# Patient Record
Sex: Female | Born: 1990 | Hispanic: Yes | Marital: Single | Smoking: Never smoker
Health system: Southern US, Community
[De-identification: ages and names within clinical notes are randomized; demographics above are authoritative.]

## PROBLEM LIST (undated history)

## (undated) ENCOUNTER — Inpatient Hospital Stay (HOSPITAL_COMMUNITY): Payer: Self-pay

## (undated) DIAGNOSIS — Z8619 Personal history of other infectious and parasitic diseases: Secondary | ICD-10-CM

## (undated) DIAGNOSIS — N200 Calculus of kidney: Secondary | ICD-10-CM

## (undated) DIAGNOSIS — H109 Unspecified conjunctivitis: Secondary | ICD-10-CM

## (undated) DIAGNOSIS — A749 Chlamydial infection, unspecified: Secondary | ICD-10-CM

## (undated) DIAGNOSIS — B009 Herpesviral infection, unspecified: Secondary | ICD-10-CM

## (undated) DIAGNOSIS — I471 Supraventricular tachycardia: Secondary | ICD-10-CM

## (undated) DIAGNOSIS — O2230 Deep phlebothrombosis in pregnancy, unspecified trimester: Secondary | ICD-10-CM

## (undated) DIAGNOSIS — E282 Polycystic ovarian syndrome: Secondary | ICD-10-CM

## (undated) HISTORY — DX: Herpesviral infection, unspecified: B00.9

## (undated) HISTORY — PX: WISDOM TOOTH EXTRACTION: SHX21

## (undated) HISTORY — DX: Chlamydial infection, unspecified: A74.9

## (undated) HISTORY — DX: Unspecified conjunctivitis: H10.9

## (undated) HISTORY — PX: FRACTURE SURGERY: SHX138

---
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
## 2009-03-04 ENCOUNTER — Other Ambulatory Visit: Admission: RE | Admit: 2009-03-04 | Discharge: 2009-03-04 | Payer: Self-pay | Admitting: Gynecology

## 2009-03-04 ENCOUNTER — Encounter: Payer: Self-pay | Admitting: Gynecology

## 2009-03-04 ENCOUNTER — Ambulatory Visit: Payer: Self-pay | Admitting: Gynecology

## 2009-11-12 ENCOUNTER — Other Ambulatory Visit: Admission: RE | Admit: 2009-11-12 | Discharge: 2009-11-12 | Payer: Self-pay | Admitting: Gynecology

## 2009-11-12 ENCOUNTER — Ambulatory Visit: Payer: Self-pay | Admitting: Gynecology

## 2010-07-16 ENCOUNTER — Other Ambulatory Visit: Admission: RE | Admit: 2010-07-16 | Discharge: 2010-07-16 | Payer: Self-pay | Admitting: Gynecology

## 2010-07-16 ENCOUNTER — Ambulatory Visit: Payer: Self-pay | Admitting: Gynecology

## 2010-07-23 ENCOUNTER — Ambulatory Visit: Payer: Self-pay | Admitting: Gynecology

## 2010-07-31 ENCOUNTER — Ambulatory Visit: Payer: Self-pay | Admitting: Gynecology

## 2011-05-11 ENCOUNTER — Ambulatory Visit (INDEPENDENT_AMBULATORY_CARE_PROVIDER_SITE_OTHER): Payer: 59 | Admitting: Gynecology

## 2011-05-11 DIAGNOSIS — N898 Other specified noninflammatory disorders of vagina: Secondary | ICD-10-CM

## 2011-05-11 DIAGNOSIS — B373 Candidiasis of vulva and vagina: Secondary | ICD-10-CM

## 2011-05-11 DIAGNOSIS — R82998 Other abnormal findings in urine: Secondary | ICD-10-CM

## 2011-05-11 DIAGNOSIS — N760 Acute vaginitis: Secondary | ICD-10-CM

## 2011-05-11 DIAGNOSIS — Z113 Encounter for screening for infections with a predominantly sexual mode of transmission: Secondary | ICD-10-CM

## 2011-08-06 ENCOUNTER — Ambulatory Visit (INDEPENDENT_AMBULATORY_CARE_PROVIDER_SITE_OTHER): Payer: 59 | Admitting: Gynecology

## 2011-08-06 ENCOUNTER — Other Ambulatory Visit (HOSPITAL_COMMUNITY)
Admission: RE | Admit: 2011-08-06 | Discharge: 2011-08-06 | Disposition: A | Discharge status: Home / Self Care | Payer: 59 | Source: Ambulatory Visit | Attending: Gynecology | Admitting: Gynecology

## 2011-08-06 ENCOUNTER — Encounter: Payer: Self-pay | Admitting: Gynecology

## 2011-08-06 DIAGNOSIS — R87810 Cervical high risk human papillomavirus (HPV) DNA test positive: Secondary | ICD-10-CM | POA: Insufficient documentation

## 2011-08-06 DIAGNOSIS — Z01419 Encounter for gynecological examination (general) (routine) without abnormal findings: Secondary | ICD-10-CM

## 2011-08-06 DIAGNOSIS — Z23 Encounter for immunization: Secondary | ICD-10-CM

## 2011-08-06 DIAGNOSIS — Z113 Encounter for screening for infections with a predominantly sexual mode of transmission: Secondary | ICD-10-CM

## 2011-08-06 DIAGNOSIS — N390 Urinary tract infection, site not specified: Secondary | ICD-10-CM

## 2011-08-06 DIAGNOSIS — R3915 Urgency of urination: Secondary | ICD-10-CM

## 2011-08-06 DIAGNOSIS — R82998 Other abnormal findings in urine: Secondary | ICD-10-CM

## 2011-08-06 LAB — URINALYSIS, ROUTINE W REFLEX MICROSCOPIC
Ketones, ur: NEGATIVE
Protein, ur: NEGATIVE
Urobilinogen, UA: 0.2

## 2011-08-06 LAB — CBC
HCT: 30 — ABNORMAL LOW
MCHC: 35.5
MCV: 92.3
RBC: 3.25 — ABNORMAL LOW
RBC: 3.53 — ABNORMAL LOW
WBC: 13 — ABNORMAL HIGH
WBC: 14.2 — ABNORMAL HIGH

## 2011-08-06 LAB — RH IMMUNE GLOB WKUP(>/=20WKS)(NOT WOMEN'S HOSP)

## 2011-08-06 LAB — PROTIME-INR: INR: 1

## 2011-08-06 LAB — RAPID URINE DRUG SCREEN, HOSP PERFORMED
Amphetamines: NOT DETECTED
Cocaine: NOT DETECTED
Opiates: NOT DETECTED
Tetrahydrocannabinol: NOT DETECTED

## 2011-08-06 LAB — TYPE AND SCREEN: Antibody Screen: NEGATIVE

## 2011-08-06 LAB — APTT: aPTT: 27

## 2011-08-06 LAB — URINE MICROSCOPIC-ADD ON

## 2011-08-06 LAB — FIBRINOGEN: Fibrinogen: 666 — ABNORMAL HIGH

## 2011-08-06 LAB — URIC ACID: Uric Acid, Serum: 3.8

## 2011-08-06 MED ORDER — NITROFURANTOIN MONOHYD MACRO 100 MG PO CAPS: 100.0000 mg | ORAL_CAPSULE | Freq: Two times a day (BID) | ORAL | Status: AC

## 2011-08-06 NOTE — Patient Instructions (Signed)
Remember to do your monthly self breast exams. Also remember that your second dose of Gardasil is end of November and the final one at the end of March. Your IUD is good for two more years.

## 2011-08-06 NOTE — Progress Notes (Signed)
Cassandra Russell 1991/04/28 [id number]   History:    20 y.o.  for annual exam was complaining of dysuria and frequency. She also wanted to have the Gardasil Vaccine series started. She appears that reviewed the information provided on the Gardasil Vaccine. She has a Mirena IUD was placed in 2009. She in frequently does her self breast examination. She does have a history of positive Chlamydia culture in the past.  Past medical history,surgical history, family history and social history were all reviewed and documented in the EPIC chart.  ROS:  Was performed and pertinent positives and negatives are included in the history.  Exam: chaperone present BP 100/68  Ht 5' 0.75" (1.543 m)  Wt 128 lb (58.06 kg)  BMI 24.38 kg/m2  Body mass index is 24.38 kg/(m^2).  General appearance : Well developed well nourished female. No acute distress HEENT: Neck supple, trachea midline, no carotid bruits, no thyroidmegaly Lungs: Clear to auscultation, no rhonchi or wheezes, or rib retractions  Heart: Regular rate and rhythm, no murmurs or gallops Breast:Examined in sitting and supine position were symmetrical in appearance, no palpable masses or tenderness,  no skin retraction, no nipple inversion, no nipple discharge, no skin discoloration, no axillary or supraclavicular lymphadenopathy Abdomen: no palpable masses or tenderness, no rebound or guarding Extremities: no edema or skin discoloration or tenderness  Pelvic:  Bartholin, Urethra, Skene Glands: Within normal limits             Vagina: No gross lesions or discharge  Cervix: No gross lesions or discharge  Uterus  anteverted, normal size, shape and consistency, non-tender and mobile  Adnexa  Without masses or tenderness  Anus and perineum  normal   Rectovaginal  normal sphincter tone without palpated masses or tenderness             Hemoccult not done     Assessment/Plan:  20 y.o. female for annual exam with urinalysis significant for urinary  tract infection. She will be placed on Macrobid 1 tablet twice a day for 7 day course. She was encouraged to do her monthly self breast examination for which literature information was provided. She received the first of 3 Gardasil Vaccine series today. CBC and Pap smear along with GC and Chlamydia culture was done today as well.    [name] MD, 12:23 PM 08/06/2011

## 2011-08-13 ENCOUNTER — Other Ambulatory Visit: Payer: Self-pay | Admitting: *Deleted

## 2011-08-13 DIAGNOSIS — A64 Unspecified sexually transmitted disease: Secondary | ICD-10-CM

## 2011-08-13 MED ORDER — AZITHROMYCIN 1 G PO PACK
1.0000 | PACK | Freq: Once | ORAL | Status: AC
Start: 2011-08-13 — End: 2011-09-12

## 2011-08-20 ENCOUNTER — Ambulatory Visit (INDEPENDENT_AMBULATORY_CARE_PROVIDER_SITE_OTHER): Payer: 59 | Admitting: Gynecology

## 2011-08-20 ENCOUNTER — Encounter: Payer: Self-pay | Admitting: Gynecology

## 2011-08-20 VITALS — BP 120/78

## 2011-08-20 DIAGNOSIS — Z113 Encounter for screening for infections with a predominantly sexual mode of transmission: Secondary | ICD-10-CM

## 2011-08-20 DIAGNOSIS — N390 Urinary tract infection, site not specified: Secondary | ICD-10-CM

## 2011-08-20 DIAGNOSIS — N879 Dysplasia of cervix uteri, unspecified: Secondary | ICD-10-CM

## 2011-08-20 NOTE — Progress Notes (Signed)
The patient is a 20 year old who see the office for gynecological examination September 27 she was recently treated for urinary tract infection GC Chlamydia culture been done to the office as well it should positive for Chlamydia. She was placed on Zithromax 1 g by mouth for 1 dose and was instructed to have her partner be tested as well. Today she was going to have an HIV RPR hepatitis B and hepatitis C screen does well. 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  Colposcopic evaluation: Detail inspection of the external genitalia vagina and cervix were undertaken with the colposcope no visual lesions were noted. Endocervical speculum was utilized transformation zone was visualized and an ECC was obtained. The IUD string was seen and no other lesions were noted.  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 Discussed recent guidelines and recommendations for followup in the turbinate was provided. She has received her first dose of the tarsal laxity last month she will return back in 2 months for her second dose of the tarsal vaccine and will repeat a GC Chlamydia culture that point. We'll have her return back to the office for followup Pap smear 6 months. The following dialyzer discussed with the patient and cotton was provided as well.                                               CIN I Mild Dysplasia   ASCCP guidelines, developed prior to the change in screening recommendations, indicated that for adolescents (age 41 to 40 according to the ASCCP guidelines) who have had cervical cytology, expectant management of confirmed CIN 1 is preferred, irrespective of the index cytologic diagnosis or a satisfactory or unsatisfactory colposcopic examination, because undetected high grade disease is uncommon in this setting, invasive cancer is rare, and regression to normal is common  Adolescents - Current guidelines from professional societies advise against commencing  cervical cancer screening before age 84. ASCCP guidelines, developed prior to the change in screening recommendations, indicated that in adolescents (age 28 to 8 according to the ASCCP guidelines) with CIN 2,3, the rate of regression is high and progression to invasive cancer is extremely small. Therefore, observation with colposcopy and cytology at six-month intervals for up to two years is preferable to excisional or ablative therapy for reliable adolescents with biopsy-confirmed CIN 2,3 as long as colposcopy is satisfactory, endocervical curettage is negative, and the possibility of occult disease is acknowledged by the patientRepeat biopsy is indicated if high grade disease persists at 12 months and treatment is indicated if it persists for 24 months.If there is a specific histological diagnosis of CIN 2 (rather than CIN 2,3), observation is preferable to treatment, while ablation or excision has traditionally been recommended for adolescents with CIN 3 or with unsatisfactory colposcopy Whether the latter recommendation should be modified is a matter of debate, given current screening guidelines and the above discussed natural history and risk/benefit data. After two consecutive negative results, the patient can return to a routine screening interval

## 2011-08-20 NOTE — Progress Notes (Signed)
Addended by: Cammie Mcgee T on: 08/20/2011 04:28 PM   Modules accepted: Orders

## 2011-08-21 ENCOUNTER — Encounter: Payer: Self-pay | Admitting: *Deleted

## 2011-08-21 LAB — HEPATITIS C ANTIBODY: HCV Ab: NEGATIVE

## 2011-08-21 NOTE — Progress Notes (Signed)
  Pt informed with results from result note 08/20/11 surgical pathology. Recall letter in computer for 6 month pap receheck.

## 2011-08-28 ENCOUNTER — Telehealth: Payer: Self-pay | Admitting: *Deleted

## 2011-08-28 NOTE — Telephone Encounter (Signed)
error 

## 2011-08-28 NOTE — Progress Notes (Signed)
Pt informed with the below note, recall letter in computer. 

## 2011-08-28 NOTE — Telephone Encounter (Signed)
Message copied by Aura Camps on Fri Aug 28, 2011 11:18 AM ------      Message from: [name]      Created: Fri Aug 28, 2011 10:00 AM       Patient was seen in the office recently for colposcopic evaluation and the fact she had low-grade SIL. Evaluation was negative with a benign ECC. Patient will followup in 6 months for followup Pap smear as previously instructed.

## 2011-08-28 NOTE — Telephone Encounter (Signed)
Pt informed with the below note. Recall letter in computer.

## 2011-09-02 ENCOUNTER — Ambulatory Visit: Payer: 59 | Admitting: Gynecology

## 2011-10-06 ENCOUNTER — Ambulatory Visit (INDEPENDENT_AMBULATORY_CARE_PROVIDER_SITE_OTHER): Payer: 59 | Admitting: Gynecology

## 2011-10-06 ENCOUNTER — Encounter: Payer: Self-pay | Admitting: Gynecology

## 2011-10-06 VITALS — BP 110/70

## 2011-10-06 DIAGNOSIS — Z23 Encounter for immunization: Secondary | ICD-10-CM

## 2011-10-06 DIAGNOSIS — Z708 Other sex counseling: Secondary | ICD-10-CM

## 2011-10-06 DIAGNOSIS — Z113 Encounter for screening for infections with a predominantly sexual mode of transmission: Secondary | ICD-10-CM

## 2011-10-06 NOTE — Progress Notes (Signed)
20 year old who presented to the office today for her second dose of the Gardasil Vaccine as well as for test of cure (treated for positive Chlamydia culture in September 2012).  Patient with Mirena IUD for contraception. Normal or light cycles no other complaints today.  Pap smear was normal in September 2012. New screening guidelines discussed as described below. GC and Chlamydia culture was obtained today. Second dose of the Gardasil Vaccine was administered today and she will return back in March of 2013 for a third and final dose.  Screening methods for cervical cancer: Joint Recommendations of the American Cancer Society, the American Society ofColposcopy and Cervical Pathology (ASCCP), and the American Society for Clinical Pathology:  Women's age 49-29 years should be tested with cervical cytology alone, and screening should be performed every 3 years. Co-testing should not be performed in women younger than 21 years of age. For women age 50-57 years of age, co-testing with cytology and HPV testing every 5 years is preferred. Screening with cytology alone every 3 years is acceptable.

## 2012-04-13 ENCOUNTER — Encounter: Payer: Self-pay | Admitting: Gynecology

## 2012-04-13 ENCOUNTER — Other Ambulatory Visit: Payer: Self-pay | Admitting: Gynecology

## 2012-04-13 ENCOUNTER — Ambulatory Visit (INDEPENDENT_AMBULATORY_CARE_PROVIDER_SITE_OTHER): Payer: 59 | Admitting: Gynecology

## 2012-04-13 VITALS — BP 116/72

## 2012-04-13 DIAGNOSIS — Z8619 Personal history of other infectious and parasitic diseases: Secondary | ICD-10-CM | POA: Insufficient documentation

## 2012-04-13 DIAGNOSIS — R300 Dysuria: Secondary | ICD-10-CM

## 2012-04-13 DIAGNOSIS — N390 Urinary tract infection, site not specified: Secondary | ICD-10-CM

## 2012-04-13 DIAGNOSIS — N898 Other specified noninflammatory disorders of vagina: Secondary | ICD-10-CM

## 2012-04-13 DIAGNOSIS — Z23 Encounter for immunization: Secondary | ICD-10-CM

## 2012-04-13 DIAGNOSIS — Z113 Encounter for screening for infections with a predominantly sexual mode of transmission: Secondary | ICD-10-CM

## 2012-04-13 HISTORY — DX: Personal history of other infectious and parasitic diseases: Z86.19

## 2012-04-13 LAB — URINALYSIS W MICROSCOPIC + REFLEX CULTURE
Hgb urine dipstick: NEGATIVE
Ketones, ur: NEGATIVE mg/dL
Nitrite: POSITIVE — AB
Protein, ur: NEGATIVE mg/dL
Urobilinogen, UA: 0.2 mg/dL (ref 0.0–1.0)

## 2012-04-13 LAB — WET PREP FOR TRICH, YEAST, CLUE
Clue Cells Wet Prep HPF POC: NONE SEEN
Yeast Wet Prep HPF POC: NONE SEEN

## 2012-04-13 MED ORDER — CLINDAMYCIN PHOSPHATE 2 % VA CREA: 1.0000 | TOPICAL_CREAM | Freq: Every day | VAGINAL | Status: AC

## 2012-04-13 MED ORDER — CIPROFLOXACIN HCL 250 MG PO TABS: 250.0000 mg | ORAL_TABLET | Freq: Two times a day (BID) | ORAL | Status: AC

## 2012-04-13 NOTE — Patient Instructions (Signed)

## 2012-04-13 NOTE — Progress Notes (Signed)
21 year old that presented to the office today complaining of frequent urination and dysuria as well as urgency. Patient denied any fever chills nausea or vomiting. Some mild mid-suprapubic discomfort. She was also complaining of a slight clear vaginal discharge. Review of her record indicated the following:  Past history of Chlamydia infection October 2012 negative RPR, hepatitis B, and hepatitis C and HIV.  September 2012:LOW GRADE SQUAMOUS INTRAEPITHELIAL LESION: CIN-1/ HPV (LSIL). With high-risk HPV  Exam: Back: No CVA tenderness Abdomen: Soft nontender no rebound or guarding slight discomfort suprapubic Pelvic: Bartholin urethra Skene was within normal limits Vagina: Slight white discharge was noted Cervix: IUD string was seen mucoid material Uterus: Anteverted normal size shape and consistency Adnexa no palpable masses or tenderness  Urinalysis: 7-10 WBC and many bacteria Wet prep to numerous to count bacteria and a few white blood cells  Assessment/plan: #1 signs and symptoms consistent with urinary tract infection will be placed on Cipro 250 mg twice a day for 3 days. Samples apply radium to take 1 by mouth 3 times a day for 2 days was provided. 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 She will be returning in 3 months for her annual exam and followup Pap smear. #4 GC and Chlamydia culture pending at time of this dictation. #5 the third Gardasil Vaccine was administered today.

## 2012-04-14 LAB — GC/CHLAMYDIA PROBE AMP, GENITAL: Chlamydia, DNA Probe: NEGATIVE

## 2012-04-16 LAB — URINE CULTURE

## 2012-07-28 ENCOUNTER — Telehealth: Payer: Self-pay | Admitting: *Deleted

## 2012-07-28 NOTE — Telephone Encounter (Signed)
Pt was given Cipro 250 mg x 6 days on 04/13/12, pt didn't take Rx as directed she missed 2 days of rx. Pt said her symptoms are now back frequent urination, light burning. Pt asked if she could have another round of medication due to rx. Please advise

## 2012-07-28 NOTE — Telephone Encounter (Signed)
We'll call a different antibiotic: Macrobid one by mouth twice a day for 7 days #14.

## 2012-08-01 MED ORDER — NITROFURANTOIN MONOHYD MACRO 100 MG PO CAPS: 100.0000 mg | ORAL_CAPSULE | Freq: Two times a day (BID) | ORAL | Status: DC

## 2012-08-01 NOTE — Telephone Encounter (Signed)
Left on voicemail rx has been sent 

## 2012-08-09 ENCOUNTER — Encounter: Payer: 59 | Admitting: Gynecology

## 2012-08-11 ENCOUNTER — Encounter: Payer: Self-pay | Admitting: Gynecology

## 2012-08-11 ENCOUNTER — Other Ambulatory Visit (HOSPITAL_COMMUNITY)
Admission: RE | Admit: 2012-08-11 | Discharge: 2012-08-11 | Disposition: A | Discharge status: Home / Self Care | Payer: 59 | Source: Ambulatory Visit | Attending: Gynecology | Admitting: Gynecology

## 2012-08-11 ENCOUNTER — Ambulatory Visit (INDEPENDENT_AMBULATORY_CARE_PROVIDER_SITE_OTHER): Payer: 59 | Admitting: Gynecology

## 2012-08-11 VITALS — BP 112/70 | Ht 61.0 in | Wt 140.0 lb

## 2012-08-11 DIAGNOSIS — R635 Abnormal weight gain: Secondary | ICD-10-CM

## 2012-08-11 DIAGNOSIS — N898 Other specified noninflammatory disorders of vagina: Secondary | ICD-10-CM

## 2012-08-11 DIAGNOSIS — Z23 Encounter for immunization: Secondary | ICD-10-CM

## 2012-08-11 DIAGNOSIS — Z01419 Encounter for gynecological examination (general) (routine) without abnormal findings: Secondary | ICD-10-CM | POA: Insufficient documentation

## 2012-08-11 DIAGNOSIS — N870 Mild cervical dysplasia: Secondary | ICD-10-CM

## 2012-08-11 DIAGNOSIS — L293 Anogenital pruritus, unspecified: Secondary | ICD-10-CM

## 2012-08-11 LAB — CBC WITH DIFFERENTIAL/PLATELET
Eosinophils Absolute: 0.2 10*3/uL (ref 0.0–0.7)
Hemoglobin: 13.6 g/dL (ref 12.0–15.0)
Lymphs Abs: 1.2 10*3/uL (ref 0.7–4.0)
MCH: 29.4 pg (ref 26.0–34.0)
MCV: 87 fL (ref 78.0–100.0)
Monocytes Relative: 9 % (ref 3–12)
Neutrophils Relative %: 73 % (ref 43–77)
RBC: 4.63 MIL/uL (ref 3.87–5.11)

## 2012-08-11 LAB — LIPID PANEL
Cholesterol: 144 mg/dL (ref 0–200)
HDL: 51 mg/dL (ref >39)
Total CHOL/HDL Ratio: 2.8 Ratio
Triglycerides: 36 mg/dL (ref <150)
VLDL: 7 mg/dL (ref 0–40)

## 2012-08-11 LAB — TSH: TSH: 0.314 u[IU]/mL — ABNORMAL LOW (ref 0.350–4.500)

## 2012-08-11 LAB — WET PREP FOR TRICH, YEAST, CLUE: Clue Cells Wet Prep HPF POC: NONE SEEN

## 2012-08-11 NOTE — Patient Instructions (Addendum)
Diphtheria, Tetanus, and Pertussis (DTaP) Vaccine What You Need to Know WHY GET VACCINATED? Diphtheria, tetanus, and pertussis are serious diseases caused by bacteria. Diphtheria and pertussis are spread from person to person. Tetanus enters the body through cuts or wounds. Diphtheria causes a thick covering in the back of the throat.  It can lead to breathing problems, paralysis, heart failure, and even death. Tetanus (Lockjaw) causes painful tightening of the muscles, usually all over the body.  It can lead to "locking" of the jaw so the victim cannot open his or her mouth or swallow. Tetanus leads to death in about 2 out of 10 cases. Pertussis (Whooping Cough) causes coughing spells so bad that it is hard for infants to eat, drink, or breathe. These spells can last for weeks.  It can lead to pneumonia, seizures (jerking and staring spells), brain damage, and death. Diphtheria, tetanus, and pertussis vaccine (DTaP) can help prevent these diseases. Most children who are vaccinated with DTaP will be protected throughout childhood. Many more children would get these diseases if we stopped vaccinating. DTaP is a safer version of an older vaccine called DTP. DTP is no longer used in the [place]. WHO SHOULD GET DTAP VACCINE AND WHEN? Children should get 5 doses of DTaP vaccine, 1 dose at each of the following ages:  2 months.  4 months.  6 months.  15 to 18 months.  4 to 6 years. DTaP may be given at the same time as other vaccines. SOME CHILDREN SHOULD NOT GET DTAP VACCINE OR SHOULD WAIT  Children with minor illnesses, such as a cold, may be vaccinated. But children who are moderately or severely ill should usually wait until they recover before getting DTaP vaccine.  Any child who had a life-threatening allergic reaction after a dose of DTaP should not get another dose.  Any child who suffered a brain or nervous system disease within 7 days after a dose of DTaP should not get  another dose.  Talk with your caregiver if your child:  Had a seizure or collapsed after a dose of DTaP.  Cried non-stop for 3 hours or more after a dose of DTaP.  Had a fever over 105 F (40.6 C) after a dose of DTaP.  Ask your caregiver for more information. Some of these children should not get another dose of pertussis vaccine, but may get a vaccine without pertussis, called DT. OLDER CHILDREN AND ADULTS  DTaP is not licensed for adolescents, adults, or children 7 years of age and older.  But older people still need protection. A vaccine called Tdap is similar to DTaP. A single dose of Tdap is recommended for people 11 through 22 years of age. Another vaccine, called Td, protects against tetanus and diphtheria, but not pertussis. It is recommended every 10 years. WHAT ARE THE RISKS FROM DTAP VACCINE?  Getting diphtheria, tetanus, or pertussis disease is much riskier than getting DTaP vaccine.  However, a vaccine, like any medicine, is capable of causing serious problems, such as severe allergic reactions. The risk of DTaP vaccine causing serious harm, or death, is extremely small. Mild Problems (Common)  Fever (up to about 1 child in 4).  Redness or swelling where the shot was given (up to about 1 child in 4).  Soreness or tenderness where the shot was given (up to about 1 child in 4). These problems occur more often after the 4th and 5th doses of the DTaP series than after earlier doses. Sometimes the 4th   or 5th dose of DTaP vaccine is followed by swelling of the entire arm or leg in which the shot was given, lasting 1 to 7 days (up to about 1 child in 30). Other mild problems include:  Fussiness (up to about 1 child in 3).  Tiredness or poor appetite (up to about 1 child in 10).  Vomiting (up to about 1 child in 50). These problems generally occur 1 to 3 days after the shot. Moderate Problems (Uncommon)  Seizure (jerking or staring) (about 1 child out of  14,000).  Non-stop crying, for 3 hours or more (up to about 1 child out of 1,000).  High fever, over 105 F (40.6 C) (about 1 child out of 16,000). Severe Problems (Very Rare)  Serious allergic reaction (less than 1 out of a million doses).  Several other severe problems have been reported after DTaP vaccine. These include:  Long-term seizures, coma, or lowered consciousness.  Permanent brain damage. These are so rare it is hard to tell if they are caused by the vaccine. Controlling fever is especially important for children who have had seizures, for any reason. It is also important if another family member has had seizures. You can reduce fever and pain by giving your child an aspirin-free pain reliever when the shot is given, and for the next 24 hours, following the package instructions. WHAT IF THERE IS A MODERATE OR SEVERE REACTION? What should I look for? Any unusual conditions, such as a serious allergic reaction, high fever, or unusual behavior. Serious allergic reactions are extremely rare with any vaccine. If one were to occur, it would most likely be within a few minutes to a few hours after the shot. Signs can include difficulty breathing, hoarseness or wheezing, hives, paleness, weakness, a fast heartbeat, or dizziness. If a high fever or seizure were to occur, it would usually be within a week after the shot. What should I do?  Call your caregiver or get the person to a caregiver right away.  Tell the caregiver what happened, the date and time it happened, and when the vaccination was given.  Ask the caregiver, nurse, or health department to file a Vaccine Adverse Event Reporting System (VAERS) form. Or, you can file this report through the VAERS website at www.vaers.hhs.gov or by calling 1-800-822-7967. VAERS does not provide medical advice. THE NATIONAL VACCINE INJURY COMPENSATION PROGRAM  In the rare event that you or your child has a serious reaction to a vaccine, a  federal program has been created to help you pay for the care of those who have been harmed.  For details about the National Vaccine Injury Compensation Program, call 1-800-338-2382 or visit the program's website at www.hrsa.gov/vaccinecompensation HOW CAN I LEARN MORE?  Ask your caregiver. They can give you the vaccine package insert or suggest other sources of information.  Call your local or state health department's immunization program.  Contact the Centers for Disease Control and Prevention (CDC):  Call 1-800-232-4636 (1-800-CDC-INFO).  Visit the National Immunization Program's website at www.cdc.gov/vaccines CDC Diphtheria, Tetanus, and Pertussis (DTaP) Vaccine VIS (03/25/06) Document Released: 08/23/2006 Document Revised: 01/18/2012 Document Reviewed: 08/23/2006 [employer] Patient Information 2013 [employer].  

## 2012-08-11 NOTE — Progress Notes (Signed)
Cassandra Russell 09/02/1991 [id number]   History:    21 y.o.  for annual gyn exam who has a past history of Chlamydia and also low-grade dysplasia. She was complaining of slight vaginal pruritus. Patient's last Pap smear September 2012 demonstrated:  Low-grade squamous intraepithelial lesion with high-risk HPV detected  Subsequent colposcopy did not demonstrate any lesions and her ECC as follows:  Diagnosis Endocervical biopsy BENIGN ENDOCERVIX ADMIXED WITH ABUNDANT MUCUS AND BLOOD.   Patient has a Mirena IUD that was placed in 2009 and has very little menses. Patient completed her her [place] vaccine series this year. Patient does not recall her had received a dTap Vaccine. Past medical history,surgical history, family history and social history were all reviewed and documented in the EPIC chart.  Gynecologic History No LMP recorded. Patient is not currently having periods (Reason: IUD). Contraception: IUD Last Pap: See above. Results were: See above Last mammogram: Not indicated. Results were: Not indicated  Obstetric History OB History    Grav Para Term Preterm Abortions TAB SAB Ect Mult Living   1 1        1      # Outc Date GA Lbr Len/2nd Wgt Sex Del Anes PTL Lv   1 PAR     M SVD          ROS: A ROS was performed and pertinent positives and negatives are included in the history.  GENERAL: No fevers or chills. HEENT: No change in vision, no earache, sore throat or sinus congestion. NECK: No pain or stiffness. CARDIOVASCULAR: No chest pain or pressure. No palpitations. PULMONARY: No shortness of breath, cough or wheeze. GASTROINTESTINAL: No abdominal pain, nausea, vomiting or diarrhea, melena or bright red blood per rectum. GENITOURINARY: No urinary frequency, urgency, hesitancy or dysuria. MUSCULOSKELETAL: No joint or muscle pain, no back pain, no recent trauma. DERMATOLOGIC: No rash, no itching, no lesions. ENDOCRINE: No polyuria, polydipsia, no heat or cold intolerance. No recent  change in weight. HEMATOLOGICAL: No anemia or easy bruising or bleeding. NEUROLOGIC: No headache, seizures, numbness, tingling or weakness. PSYCHIATRIC: No depression, no loss of interest in normal activity or change in sleep pattern.     Exam: chaperone present  BP 112/70  Ht 5\' 1"  (1.549 m)  Wt 140 lb (63.504 kg)  BMI 26.45 kg/m2  Body mass index is 26.45 kg/(m^2).  General appearance : Well developed well nourished female. No acute distress HEENT: Neck supple, trachea midline, no carotid bruits, no thyroidmegaly Lungs: Clear to auscultation, no rhonchi or wheezes, or rib retractions  Heart: Regular rate and rhythm, no murmurs or gallops Breast:Examined in sitting and supine position were symmetrical in appearance, no palpable masses or tenderness,  no skin retraction, no nipple inversion, no nipple discharge, no skin discoloration, no axillary or supraclavicular lymphadenopathy Abdomen: no palpable masses or tenderness, no rebound or guarding Extremities: no edema or skin discoloration or tenderness  Pelvic:  Bartholin, Urethra, Skene Glands: Within normal limits             Vagina: No gross lesions or discharge  Cervix: No gross lesions or discharge, IUD string barely visible  Uterus  anteverted, normal size, shape and consistency, non-tender and mobile  Adnexa  Without masses or tenderness  Anus and perineum  normal   Rectovaginal  normal sphincter tone without palpated masses or tenderness             Hemoccult not indicated   Wet prep demonstrated moderate amount of bacteria   Assessment/Plan:  21 y.o. female for annual exam patient was treated for urinary tract infection June 5 of this year. She has a Mirena IUD for contraception the need to be changed next year. She was instructed to do her monthly self breast examination. Patient had a negative GC and Chlamydia culture in June of this year. Pap smear was repeated today. The following labs were ordered: Fasting blood sugar,  fasting lipid profile, TSH, CBC and urinalysis. Patient was counseled and she received a dTap Vaccine today.    [name] MD, 1:58 PM 08/11/2012

## 2012-08-12 ENCOUNTER — Other Ambulatory Visit: Payer: Self-pay | Admitting: Gynecology

## 2012-08-12 ENCOUNTER — Encounter: Payer: Self-pay | Admitting: Gynecology

## 2012-08-12 DIAGNOSIS — R7989 Other specified abnormal findings of blood chemistry: Secondary | ICD-10-CM

## 2012-08-12 LAB — URINALYSIS W MICROSCOPIC + REFLEX CULTURE
Hgb urine dipstick: NEGATIVE
Leukocytes, UA: NEGATIVE
Nitrite: NEGATIVE
Protein, ur: NEGATIVE mg/dL
pH: 6 (ref 5.0–8.0)

## 2012-08-15 ENCOUNTER — Other Ambulatory Visit: Payer: Self-pay | Admitting: Gynecology

## 2012-08-15 MED ORDER — FLUCONAZOLE 150 MG PO TABS: 150.0000 mg | ORAL_TABLET | Freq: Once | ORAL | Status: DC

## 2013-08-14 ENCOUNTER — Encounter: Payer: Self-pay | Admitting: Gynecology

## 2013-08-14 ENCOUNTER — Ambulatory Visit (INDEPENDENT_AMBULATORY_CARE_PROVIDER_SITE_OTHER): Payer: BC Managed Care – PPO | Admitting: Gynecology

## 2013-08-14 ENCOUNTER — Other Ambulatory Visit (HOSPITAL_COMMUNITY)
Admission: RE | Admit: 2013-08-14 | Discharge: 2013-08-14 | Disposition: A | Discharge status: Home / Self Care | Payer: Medicaid Other | Source: Ambulatory Visit | Attending: Gynecology | Admitting: Gynecology

## 2013-08-14 VITALS — BP 128/86 | Ht 60.5 in | Wt 139.0 lb

## 2013-08-14 DIAGNOSIS — Z113 Encounter for screening for infections with a predominantly sexual mode of transmission: Secondary | ICD-10-CM

## 2013-08-14 DIAGNOSIS — R635 Abnormal weight gain: Secondary | ICD-10-CM

## 2013-08-14 DIAGNOSIS — Z01419 Encounter for gynecological examination (general) (routine) without abnormal findings: Secondary | ICD-10-CM

## 2013-08-14 DIAGNOSIS — Z30432 Encounter for removal of intrauterine contraceptive device: Secondary | ICD-10-CM

## 2013-08-14 DIAGNOSIS — L659 Nonscarring hair loss, unspecified: Secondary | ICD-10-CM

## 2013-08-14 LAB — CBC WITH DIFFERENTIAL/PLATELET
Eosinophils Relative: 2 % (ref 0–5)
HCT: 38.7 % (ref 36.0–46.0)
Hemoglobin: 13.6 g/dL (ref 12.0–15.0)
Lymphocytes Relative: 36 % (ref 12–46)
Lymphs Abs: 2 10*3/uL (ref 0.7–4.0)
MCV: 86 fL (ref 78.0–100.0)
Monocytes Absolute: 0.3 10*3/uL (ref 0.1–1.0)
RBC: 4.5 MIL/uL (ref 3.87–5.11)
WBC: 5.7 10*3/uL (ref 4.0–10.5)

## 2013-08-14 LAB — URINALYSIS W MICROSCOPIC + REFLEX CULTURE
Bilirubin Urine: NEGATIVE
Glucose, UA: NEGATIVE mg/dL
Ketones, ur: NEGATIVE mg/dL
Protein, ur: NEGATIVE mg/dL
RBC / HPF: NONE SEEN RBC/hpf (ref <3)
Specific Gravity, Urine: >1.03 — ABNORMAL HIGH (ref 1.005–1.030)
Urobilinogen, UA: 0.2 mg/dL (ref 0.0–1.0)

## 2013-08-14 LAB — LIPID PANEL
HDL: 56 mg/dL (ref >39)
Triglycerides: 62 mg/dL (ref <150)

## 2013-08-14 LAB — HIV ANTIBODY (ROUTINE TESTING W REFLEX): HIV: NONREACTIVE

## 2013-08-14 LAB — HEPATITIS C ANTIBODY: HCV Ab: NEGATIVE

## 2013-08-14 LAB — THYROID PANEL WITH TSH
Free Thyroxine Index: 3.7 (ref 1.0–3.9)
TSH: 0.827 u[IU]/mL (ref 0.350–4.500)

## 2013-08-14 MED ORDER — CIPROFLOXACIN HCL 250 MG PO TABS: 250.0000 mg | ORAL_TABLET | Freq: Two times a day (BID) | ORAL | Status: DC

## 2013-08-14 NOTE — Progress Notes (Signed)
Cassandra Russell Oct 11, 1991 [id number]   History:    22 y.o.  for annual gyn exam who was requesting an STD screen. She works in dialysis as a med check. She was also complaining of some hair loss at times. She is also complaining of dysuria and frequency. Patient also wanted to have the Mirena IUD that was placing 2009. She is contemplating perhaps maybe getting pregnant in the near future and will use barrier contraception for now. Patient has a past history of Chlamydia and also low-grade dysplasia. 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  Pap smear was normal 2013. Patient completed Gardasil vaccine series in 2013.   Past medical history,surgical history, family history and social history were all reviewed and documented in the EPIC chart.  Gynecologic History No LMP recorded. Patient is not currently having periods (Reason: IUD). Contraception: IUD Last Pap: 2013. Results were: normal Last mammogram: that indicated. Results were: none indicated  Obstetric History OB History  Gravida Para Term Preterm AB SAB TAB Ectopic Multiple Living  1 1        1     # Outcome Date GA Lbr Len/2nd Weight Sex Delivery Anes PTL Lv  1 PAR     M SVD          ROS: A ROS was performed and pertinent positives and negatives are included in the history.  GENERAL: No fevers or chills. HEENT: No change in vision, no earache, sore throat or sinus congestion. NECK: No pain or stiffness. CARDIOVASCULAR: No chest pain or pressure. No palpitations. PULMONARY: No shortness of breath, cough or wheeze. GASTROINTESTINAL: No abdominal pain, nausea, vomiting or diarrhea, melena or bright red blood per rectum. GENITOURINARY: No urinary frequency, urgency, hesitancy or  dysuria. MUSCULOSKELETAL: No joint or muscle pain, no back pain, no recent trauma. DERMATOLOGIC: No rash, no itching, no lesions. ENDOCRINE: No polyuria, polydipsia, no heat or cold intolerance. No recent change in weight. HEMATOLOGICAL: No anemia or easy bruising or bleeding. NEUROLOGIC: No headache, seizures, numbness, tingling or weakness. PSYCHIATRIC: No depression, no loss of interest in normal activity or change in sleep pattern.     Exam: chaperone present  BP 128/86  Ht 5' 0.5" (1.537 m)  Wt 139 lb (63.05 kg)  BMI 26.69 kg/m2  Body mass index is 26.69 kg/(m^2).  General appearance : Well developed well nourished female. No acute distress HEENT: Neck supple, trachea midline, no carotid bruits, no thyroidmegaly Lungs: Clear to auscultation, no rhonchi or wheezes, or rib retractions  Heart: Regular rate and rhythm, no murmurs or gallops Breast:Examined in sitting and supine position were symmetrical in appearance, no palpable masses or tenderness,  no skin retraction, no nipple inversion, no nipple discharge, no skin discoloration, no axillary or supraclavicular lymphadenopathy Abdomen: no palpable masses or tenderness, no rebound or guarding Extremities: no edema or skin discoloration or tenderness  Pelvic:  Bartholin, Urethra, Skene Glands: Within normal limits             Vagina: No gross lesions or discharge  Cervix: No gross lesions or discharge  Uterus  anteverted, normal size, shape and consistency, non-tender and mobile  Adnexa  Without masses or tenderness  Anus and perineum  normal   Rectovaginal  normal sphincter tone without palpated masses or tenderness  Hemoccult when indicated   The cervix was cleansed with Betadine solution. The IUD string was not seen so a [place] clamp was placed in the endocervical canal to grasp the IUD string and retrieved it showed it to the patient and discarded.  Assessment/Plan:  22 y.o. female for annual exam who requested to  have her Mirena IUD removed. She will use very contraception. Literature formation on the ParaGard T380A nonhormonal IUD as well as the Nexplanon was provided. Patient's urinalysis today demonstrated 11-20 white blood cells per high-power field many bacteria. She will be treated for urinary tract infection with Cipro 250 mg one by mouth twice a day for 3 days. She was given sample of Urigesic to take one by mouth 4 times a day 2 days as an anti-spasmodic agent and to drink linear fluids. The following labs were ordered: Fasting lipid profile, thyroid function panel (last year TSH was borderline low), CBC, urinalysis, HIV, RPR and hepatitis C. GC from a culture pain resulting in time of this dictation. Pap smear done today if normal once again there will go to every 3 years as per new recommendations.  Note: This dictation was prepared with  Dragon/digital dictation along withSmart phrase technology. Any transcriptional errors that result from this process are unintentional.   [name] MD, 10:48 AM 08/14/2013

## 2013-08-14 NOTE — Patient Instructions (Signed)
Urinary Tract Infection  Urinary tract infections (UTIs) can develop anywhere along your urinary tract. Your urinary tract is your body's drainage system for removing wastes and extra water. Your urinary tract includes two kidneys, two ureters, a bladder, and a urethra. Your kidneys are a pair of bean-shaped organs. Each kidney is about the size of your fist. They are located below your ribs, one on each side of your spine.  CAUSES  Infections are caused by microbes, which are microscopic organisms, including fungi, viruses, and bacteria. These organisms are so small that they can only be seen through a microscope. Bacteria are the microbes that most commonly cause UTIs.  SYMPTOMS   Symptoms of UTIs may vary by age and gender of the patient and by the location of the infection. Symptoms in young women typically include a frequent and intense urge to urinate and a painful, burning feeling in the bladder or urethra during urination. Older women and men are more likely to be tired, shaky, and weak and have muscle aches and abdominal pain. A fever may mean the infection is in your kidneys. Other symptoms of a kidney infection include pain in your back or sides below the ribs, nausea, and vomiting.  DIAGNOSIS  To diagnose a UTI, your caregiver will ask you about your symptoms. Your caregiver also will ask to provide a urine sample. The urine sample will be tested for bacteria and white blood cells. White blood cells are made by your body to help fight infection.  TREATMENT   Typically, UTIs can be treated with medication. Because most UTIs are caused by a bacterial infection, they usually can be treated with the use of antibiotics. The choice of antibiotic and length of treatment depend on your symptoms and the type of bacteria causing your infection.  HOME CARE INSTRUCTIONS   If you were prescribed antibiotics, take them exactly as your caregiver instructs you. Finish the medication even if you feel better after you  have only taken some of the medication.   Drink enough water and fluids to keep your urine clear or pale yellow.   Avoid caffeine, tea, and carbonated beverages. They tend to irritate your bladder.   Empty your bladder often. Avoid holding urine for long periods of time.   Empty your bladder before and after sexual intercourse.   After a bowel movement, women should cleanse from front to back. Use each tissue only once.  SEEK MEDICAL CARE IF:    You have back pain.   You develop a fever.   Your symptoms do not begin to resolve within 3 days.  SEEK IMMEDIATE MEDICAL CARE IF:    You have severe back pain or lower abdominal pain.   You develop chills.   You have nausea or vomiting.   You have continued burning or discomfort with urination.  MAKE SURE YOU:    Understand these instructions.   Will watch your condition.   Will get help right away if you are not doing well or get worse.  Document Released: 08/05/2005 Document Revised: 04/26/2012 Document Reviewed: 12/04/2011  [employer] Patient Information 2014 [employer].

## 2013-08-15 LAB — GC/CHLAMYDIA PROBE AMP: CT Probe RNA: NEGATIVE

## 2013-08-16 ENCOUNTER — Telehealth: Payer: Self-pay | Admitting: Gynecology

## 2013-08-16 LAB — URINE CULTURE

## 2013-08-16 NOTE — Telephone Encounter (Signed)
08/16/13-Pt was advised today that her insurance will cover either the Paraguard IUD or Nexplanon at 100%, no copay. She will call to let us know which she wants. Pt was also reminded that either needs insertion while on cycle/WL

## 2013-08-18 ENCOUNTER — Other Ambulatory Visit: Payer: Self-pay | Admitting: Gynecology

## 2013-08-18 MED ORDER — NITROFURANTOIN MONOHYD MACRO 100 MG PO CAPS: 100.0000 mg | ORAL_CAPSULE | Freq: Two times a day (BID) | ORAL | Status: DC

## 2013-09-11 ENCOUNTER — Telehealth: Payer: Self-pay | Admitting: *Deleted

## 2013-09-11 DIAGNOSIS — N390 Urinary tract infection, site not specified: Secondary | ICD-10-CM

## 2013-09-11 MED ORDER — URIBEL 118 MG PO CAPS: 118.0000 mg | ORAL_CAPSULE | Freq: Four times a day (QID) | ORAL | Status: DC

## 2013-09-11 MED ORDER — NITROFURANTOIN MONOHYD MACRO 100 MG PO CAPS: 100.0000 mg | ORAL_CAPSULE | Freq: Two times a day (BID) | ORAL | Status: DC

## 2013-09-11 MED ORDER — FLUCONAZOLE 150 MG PO TABS: 150.0000 mg | ORAL_TABLET | Freq: Once | ORAL | Status: DC

## 2013-09-11 NOTE — Telephone Encounter (Signed)
Pt informed, rx sent, order placed for repeat u/a

## 2013-09-11 NOTE — Telephone Encounter (Signed)
Please call in the following: #1 Macrobid 1 by mouth twice a day for 7 days #2 Uribell one by mouth 4 times a day for 2 days #8 (if patient not pregnant) #3 patient to come by the office for urinalysis the week after she finishes the antibiotic treatment

## 2013-09-11 NOTE — Telephone Encounter (Signed)
Ok for Diflucan 

## 2013-09-11 NOTE — Telephone Encounter (Signed)
Pt called requesting refill on Macrobid that was given on 08/16/13 pt did not take medication as directed , skipped several days. Starting to have urgency and burning with urination again. She also asked if diflucan can be given for her after taking Macrobid due to yeast infection. Please advise

## 2013-09-11 NOTE — Telephone Encounter (Signed)
Diflucan due to yeast after Rx?

## 2013-10-09 DIAGNOSIS — I471 Supraventricular tachycardia, unspecified: Secondary | ICD-10-CM

## 2013-10-09 HISTORY — DX: Supraventricular tachycardia, unspecified: I47.10

## 2013-10-09 HISTORY — DX: Supraventricular tachycardia: I47.1

## 2013-10-17 ENCOUNTER — Emergency Department (HOSPITAL_COMMUNITY)
Admission: EM | Admit: 2013-10-17 | Discharge: 2013-10-17 | Disposition: A | Discharge status: Home / Self Care | Payer: BC Managed Care – PPO | Attending: Emergency Medicine | Admitting: Emergency Medicine

## 2013-10-17 ENCOUNTER — Emergency Department (HOSPITAL_COMMUNITY): Payer: BC Managed Care – PPO

## 2013-10-17 ENCOUNTER — Encounter (HOSPITAL_COMMUNITY): Payer: Self-pay | Admitting: Emergency Medicine

## 2013-10-17 ENCOUNTER — Other Ambulatory Visit: Payer: Self-pay

## 2013-10-17 DIAGNOSIS — R51 Headache: Secondary | ICD-10-CM | POA: Insufficient documentation

## 2013-10-17 DIAGNOSIS — F411 Generalized anxiety disorder: Secondary | ICD-10-CM | POA: Insufficient documentation

## 2013-10-17 DIAGNOSIS — E876 Hypokalemia: Secondary | ICD-10-CM | POA: Insufficient documentation

## 2013-10-17 DIAGNOSIS — Z8619 Personal history of other infectious and parasitic diseases: Secondary | ICD-10-CM | POA: Insufficient documentation

## 2013-10-17 DIAGNOSIS — Z3202 Encounter for pregnancy test, result negative: Secondary | ICD-10-CM | POA: Insufficient documentation

## 2013-10-17 DIAGNOSIS — I498 Other specified cardiac arrhythmias: Secondary | ICD-10-CM | POA: Insufficient documentation

## 2013-10-17 DIAGNOSIS — R002 Palpitations: Secondary | ICD-10-CM | POA: Insufficient documentation

## 2013-10-17 DIAGNOSIS — I471 Supraventricular tachycardia: Secondary | ICD-10-CM

## 2013-10-17 DIAGNOSIS — R209 Unspecified disturbances of skin sensation: Secondary | ICD-10-CM | POA: Insufficient documentation

## 2013-10-17 LAB — CBC WITH DIFFERENTIAL/PLATELET
Basophils Relative: 0 % (ref 0–1)
Eosinophils Absolute: 0.2 10*3/uL (ref 0.0–0.7)
Eosinophils Relative: 2 % (ref 0–5)
HCT: 39 % (ref 36.0–46.0)
Lymphocytes Relative: 39 % (ref 12–46)
Lymphs Abs: 3.4 10*3/uL (ref 0.7–4.0)
MCH: 30.2 pg (ref 26.0–34.0)
MCHC: 34.4 g/dL (ref 30.0–36.0)
MCV: 88 fL (ref 78.0–100.0)
Monocytes Absolute: 0.6 10*3/uL (ref 0.1–1.0)
Neutrophils Relative %: 52 % (ref 43–77)
RBC: 4.43 MIL/uL (ref 3.87–5.11)
RDW: 11.8 % (ref 11.5–15.5)
WBC: 8.7 10*3/uL (ref 4.0–10.5)

## 2013-10-17 LAB — POCT I-STAT, CHEM 8
BUN: 9 mg/dL (ref 6–23)
Calcium, Ion: 1.14 mmol/L (ref 1.12–1.23)
Creatinine, Ser: 0.9 mg/dL (ref 0.50–1.10)
HCT: 42 % (ref 36.0–46.0)
Sodium: 141 mEq/L (ref 135–145)
TCO2: 22 mmol/L (ref 0–100)

## 2013-10-17 LAB — URINALYSIS, ROUTINE W REFLEX MICROSCOPIC
Bilirubin Urine: NEGATIVE
Glucose, UA: NEGATIVE mg/dL
Hgb urine dipstick: NEGATIVE
Ketones, ur: NEGATIVE mg/dL
Leukocytes, UA: NEGATIVE
Protein, ur: NEGATIVE mg/dL

## 2013-10-17 MED ORDER — ADENOSINE 6 MG/2ML IV SOLN
6.0000 mg | Freq: Once | INTRAVENOUS | Status: AC
  Administered 2013-10-17: 6 mg via INTRAVENOUS

## 2013-10-17 MED ORDER — ADENOSINE 6 MG/2ML IV SOLN
INTRAVENOUS | Status: AC
  Filled 2013-10-17: qty 4

## 2013-10-17 MED ORDER — ADENOSINE 6 MG/2ML IV SOLN
INTRAVENOUS | Status: AC
  Filled 2013-10-17: qty 2

## 2013-10-17 MED ORDER — SODIUM CHLORIDE 0.9 % IV BOLUS (SEPSIS)
1000.0000 mL | Freq: Once | INTRAVENOUS | Status: AC
  Administered 2013-10-17: 1000 mL via INTRAVENOUS

## 2013-10-17 MED ORDER — DILTIAZEM HCL 25 MG/5ML IV SOLN
10.0000 mg | Freq: Once | INTRAVENOUS | Status: AC
  Administered 2013-10-17: 10 mg via INTRAVENOUS
  Filled 2013-10-17: qty 5

## 2013-10-17 MED ORDER — ADENOSINE 6 MG/2ML IV SOLN
12.0000 mg | Freq: Once | INTRAVENOUS | Status: AC
  Administered 2013-10-17: 12 mg via INTRAVENOUS

## 2013-10-17 NOTE — ED Notes (Addendum)
Pt anxious, hyperventilating, c/o CP, sob, light headedness, R arm tingling, onset around , "onset while son was having night terrors and I was trying to calm him down". No h/o same, HR 150. Denies pain at this time. Friend at [facility]. Pt trying to slow down breathing. Speech pressured.

## 2013-10-17 NOTE — ED Provider Notes (Signed)
CSN: [id number]     Arrival date & time 10/17/13  0251 History   First MD Initiated Contact with Patient 10/17/13 0320     Chief Complaint  Patient presents with  . Chest Pain  . Hyperventilating  . Headache  . Tingling  . Shortness of Breath   (Consider location/radiation/quality/duration/timing/severity/associated sxs/prior Treatment) HPI 22 year old female presents to emergency department from home with complaint of palpitations, chest pain, tingling, and shortness of breath.  Symptoms started just prior to arrival.  When her son woke up with a nightmare.  Patient reports she feels that her heart is pounding out of her chest.  She denies previous history of same.  She is unable to catch her breath.  She has tingling around her mouth and in her fingers. Past Medical History  Diagnosis Date  . Chlamydia   . Conjunctivitis    Past Surgical History  Procedure Laterality Date  . Intrauterine device insertion  2009   Family History  Problem Relation Age of Onset  . Hypertension Mother    History  Substance Use Topics  . Smoking status: Never Smoker   . Smokeless tobacco: Never Used  . Alcohol Use: No   OB History   Grav Para Term Preterm Abortions TAB SAB Ect Mult Living   1 1        1      Review of Systems  See History of Present Illness; otherwise all other systems are reviewed and negative Allergies  Review of patient's allergies indicates no known allergies.  Home Medications   Current Outpatient Rx  Name  Route  Sig  Dispense  Refill  . levonorgestrel (MIRENA) 20 MCG/24HR IUD   Intrauterine   1 each by Intrauterine route once. 2009           BP 124/70  Pulse 126  Temp(Src) 98.2 F (36.8 C) (Oral)  Resp 20  SpO2 100%  LMP 09/29/2013 Physical Exam  Constitutional: She is oriented to person, place, and time. She appears well-developed and well-nourished. She appears distressed.  Anxious appearing  HENT:  Head: Normocephalic and atraumatic.  Nose: Nose  normal.  Mouth/Throat: Oropharynx is clear and moist.  Eyes: Conjunctivae and EOM are normal. Pupils are equal, round, and reactive to light.  Neck: Normal range of motion. Neck supple. No JVD present. No tracheal deviation present. No thyromegaly present.  Cardiovascular: Normal rate, regular rhythm, normal heart sounds and intact distal pulses.  Exam reveals no gallop and no friction rub.   No murmur heard. Tachycardia  Pulmonary/Chest: Effort normal and breath sounds normal. No stridor. No respiratory distress. She has no wheezes. She has no rales. She exhibits no tenderness.  Tachypnea  Abdominal: Soft. Bowel sounds are normal. She exhibits no distension and no mass. There is no tenderness. There is no rebound and no guarding.  Musculoskeletal: Normal range of motion. She exhibits no edema and no tenderness.  Lymphadenopathy:    She has no cervical adenopathy.  Neurological: She is alert and oriented to person, place, and time. She exhibits normal muscle tone. Coordination normal.  Skin: Skin is warm and dry. No rash noted. No erythema. No pallor.  Psychiatric: She has a normal mood and affect. Her behavior is normal. Judgment and thought content normal.    ED Course  Procedures (including critical care time)  CRITICAL CARE Performed by: Olivia Mackie Total critical care time: 30 min Critical care time was exclusive of separately billable procedures and treating other patients. Critical care was  necessary to treat or prevent imminent or life-threatening deterioration. Critical care was time spent personally by me on the following activities: development of treatment plan with patient and/or surrogate as well as nursing, discussions with consultants, evaluation of patient's response to treatment, examination of patient, obtaining history from patient or surrogate, ordering and performing treatments and interventions, ordering and review of laboratory studies, ordering and review of  radiographic studies, pulse oximetry and re-evaluation of patient's condition.  Labs Review Labs Reviewed  POCT I-STAT, CHEM 8 - Abnormal; Notable for the following:    Potassium 3.0 (*)    Glucose, Bld 104 (*)    All other components within normal limits  CBC WITH DIFFERENTIAL  URINALYSIS, ROUTINE W REFLEX MICROSCOPIC  PREGNANCY, URINE   Imaging Review Dg Chest Port 1 View  10/17/2013   CLINICAL DATA:  Chest pain and tachycardia.  EXAM: PORTABLE CHEST - 1 VIEW  COMPARISON:  None.  FINDINGS: The lungs are well-aerated and clear. There is no evidence of focal opacification, pleural effusion or pneumothorax.  The cardiomediastinal silhouette is within normal limits. No acute osseous abnormalities are seen. External pacing pads are noted.  IMPRESSION: No acute cardiopulmonary process seen.   Electronically Signed   By: Roanna Raider M.D.   On: 10/17/2013 03:55    EKG Interpretation    Date/Time:  Tuesday October 17 2013 02:56:33 EST Ventricular Rate:  151 PR Interval:  99 QRS Duration: 68 QT Interval:  373 QTC Calculation: 591 R Axis:   80 Text Interpretation:  Supraventricular tachycardia Borderline Q waves in lateral leads ST depr, consider ischemia, inferior leads Prolonged QT interval Confirmed by Haya Hemler  MD, Shavaughn Seidl ([id number]) on 10/17/2013 4:40:19 AM            MDM   1. Supraventricular tachycardia   2. Hypokalemia    22 year old female with head CT with heart rates in the 150s.  Patient was given adenosine 6, 12, and 12.  Patient without specific break in her tachycardia, but noted to have P waves and rate decreased into the 130s after third round of adenosine.  She extremely anxious.  She was given a fluid bolus and 10 mg of Cardizem with improvement in her heart rate gradually into the 80s.  Patient noted to have slightly low potassium.  Expect this was secondary to her hyperventilation.  Patient given instructions to increase potassium in her diet.  We'll refer her to  cardiology for reevaluation.  She is feeling better and is ready to home    Olivia Mackie, MD 10/17/13 [phone]

## 2013-10-17 NOTE — ED Notes (Signed)
Dr. Otter into room. 

## 2013-11-30 ENCOUNTER — Encounter: Payer: Self-pay | Admitting: Internal Medicine

## 2013-11-30 ENCOUNTER — Encounter (INDEPENDENT_AMBULATORY_CARE_PROVIDER_SITE_OTHER): Payer: Self-pay

## 2013-11-30 ENCOUNTER — Ambulatory Visit (INDEPENDENT_AMBULATORY_CARE_PROVIDER_SITE_OTHER): Payer: BC Managed Care – PPO | Admitting: Internal Medicine

## 2013-11-30 VITALS — BP 90/70 | HR 70 | Ht 60.5 in | Wt 141.8 lb

## 2013-11-30 DIAGNOSIS — I471 Supraventricular tachycardia: Secondary | ICD-10-CM

## 2013-11-30 DIAGNOSIS — E876 Hypokalemia: Secondary | ICD-10-CM

## 2013-11-30 DIAGNOSIS — I498 Other specified cardiac arrhythmias: Secondary | ICD-10-CM

## 2013-11-30 LAB — BASIC METABOLIC PANEL
BUN: 10 mg/dL (ref 6–23)
CALCIUM: 9.5 mg/dL (ref 8.4–10.5)
CO2: 29 mEq/L (ref 19–32)
Chloride: 104 mEq/L (ref 96–112)
Creatinine, Ser: 0.6 mg/dL (ref 0.4–1.2)
GFR: 121.19 mL/min (ref >60.00)
GLUCOSE: 81 mg/dL (ref 70–99)
POTASSIUM: 3.6 meq/L (ref 3.5–5.1)
Sodium: 139 mEq/L (ref 135–145)

## 2013-11-30 NOTE — Patient Instructions (Signed)
Your physician recommends that you have lab work done --BMP (Basic metabolic panel)  Your physician recommends that you call for appointment as needed.

## 2013-11-30 NOTE — Progress Notes (Signed)
HPI Patinet is a 23 yo who is referred from ER for evaluation of SVT.   On 12/9 had been up with son who had  night terror.  After he went to bed she did not feel good  ANxious.  R side of , R chest, back of head begain to feel numb  Fingers numb. Heart racing. Earlier that day she had the shakes  Admits that day to drinking a lot of caffeine including a Red bull She wetn to ER  EKG showed ST heart rates in the 150s. Patient was given adenosine 6, 12, and 12. Patient without specific break in her tachycardia,rate decreased into the 130s after third round of adenosine. She was noted to be extremely anxious. She was given a fluid bolus and 10 mg of Cardizem with improvement in her heart rate gradually into the 80s. Patient noted to have slightly low potassium.  Occasional chest discomfort  Occurs with and without activy Occasionally feels like something sitting on chest She goes to school  Works part time  Has 1 child 71 yo (born premature)   Past Medical History   Diagnosis       No Known Allergies  No current outpatient prescriptions on file.   No current facility-administered medications for this visit.    Past Medical History  Diagnosis Date  . Chlamydia   . Conjunctivitis     Past Surgical History  Procedure Laterality Date  . Intrauterine device insertion  2009    Family History  Problem Relation Age of Onset  . Hypertension Mother     History   Social History  . Marital Status: Single    Spouse Name: N/A    Number of Children: N/A  . Years of Education: N/A   Occupational History  . Not on file.   Social History Main Topics  . Smoking status: Never Smoker   . Smokeless tobacco: Never Used  . Alcohol Use: No  . Drug Use: No  . Sexual Activity: Yes    Birth Control/ Protection: IUD     Comment: mirena   Other Topics Concern  . Not on file   Social History Narrative  . No narrative on file    Review of Systems:  All systems reviewed.  They are  negative to the above problem except as previously stated.  Vital Signs: BP 90/70  Pulse 70  Ht 5' 0.5" (1.537 m)  Wt 141 lb 12.8 oz (64.32 kg)  BMI 27.23 kg/m2  Physical Exam Patinet is in NAD HEENT:  Normocephalic, atraumatic. EOMI, PERRLA.  Neck: JVP is normal.  No bruits.  Lungs: clear to auscultation. No rales no wheezes.  Heart: Regular rate and rhythm. Normal S1, S2. No S3.   No significant murmurs. PMI not displaced.  Abdomen:  Supple, nontender. Normal bowel sounds. No masses. No hepatomegaly.  Extremities:   Good distal pulses throughout. No lower extremity edema.  Musculoskeletal :moving all extremities.  Neuro:   alert and oriented x3.  CN II-XII grossly intact.  EKG  SR 70 bpm  Assessment and Plan: 1.  Tachycardia  I do not see an EKG that shows an SVT  All EKGs from ER show ST. This goes along with gradual slowing of heart rate with adenosine, not abrupt conversion. Probably exacerbated by anxiety and caffeine I have told her to avoid excess caffeine  Stay hydrated, (can add a little salt if feels dizzy  BP is 90/) I would follow up prn if has  recurrence  2.  Numbness.  Symptoms sound like she was hyperventilating  3.  Hypokalemia  Recheck to confirm normalized  4.  HA  Patient with frontal headaches a lot.  Increase fluids and follow  5.  HCM  Lipids good.  TSH nornal (fall 2014)

## 2014-01-17 ENCOUNTER — Encounter (HOSPITAL_COMMUNITY): Payer: Self-pay | Admitting: Emergency Medicine

## 2014-01-17 ENCOUNTER — Emergency Department (HOSPITAL_COMMUNITY): Payer: Medicaid Other

## 2014-01-17 ENCOUNTER — Emergency Department (HOSPITAL_COMMUNITY)
Admission: EM | Admit: 2014-01-17 | Discharge: 2014-01-17 | Disposition: A | Discharge status: Home / Self Care | Payer: Medicaid Other | Attending: Emergency Medicine | Admitting: Emergency Medicine

## 2014-01-17 DIAGNOSIS — Z8669 Personal history of other diseases of the nervous system and sense organs: Secondary | ICD-10-CM | POA: Insufficient documentation

## 2014-01-17 DIAGNOSIS — Z8619 Personal history of other infectious and parasitic diseases: Secondary | ICD-10-CM | POA: Insufficient documentation

## 2014-01-17 DIAGNOSIS — S62629A Displaced fracture of medial phalanx of unspecified finger, initial encounter for closed fracture: Secondary | ICD-10-CM

## 2014-01-17 DIAGNOSIS — W010XXA Fall on same level from slipping, tripping and stumbling without subsequent striking against object, initial encounter: Secondary | ICD-10-CM | POA: Insufficient documentation

## 2014-01-17 DIAGNOSIS — IMO0002 Reserved for concepts with insufficient information to code with codable children: Secondary | ICD-10-CM | POA: Insufficient documentation

## 2014-01-17 DIAGNOSIS — Y939 Activity, unspecified: Secondary | ICD-10-CM | POA: Insufficient documentation

## 2014-01-17 DIAGNOSIS — Y9289 Other specified places as the place of occurrence of the external cause: Secondary | ICD-10-CM | POA: Insufficient documentation

## 2014-01-17 MED ORDER — OXYCODONE-ACETAMINOPHEN 5-325 MG PO TABS: 1.0000 | ORAL_TABLET | Freq: Three times a day (TID) | ORAL | Status: DC | PRN

## 2014-01-17 NOTE — Progress Notes (Signed)
Orthopedic Tech Progress Note Patient Details:  Andree CossLeslie A Malave 12-14-1990 [id number]  Ortho Devices Type of Ortho Device: Finger splint Ortho Device/Splint Interventions: Application   Cammer, Mickie BailJennifer Carol 01/17/2014, 10:24 AM

## 2014-01-17 NOTE — ED Notes (Signed)
Ice applied to area.   Ortho called to come and splint finger.

## 2014-01-17 NOTE — Discharge Instructions (Signed)
Please call and setup an appointment with Dr. Orlan Leavens, hand specialist to be reassessed early next week Please rest, ice, elevate the hands Splint cannot get wet-please cover when showering Please rest and stay hydrated Please avoid any physical or shortness activity Please take medications as prescribed-while on pain medications his be absolute no drinking alcohol, driving, operating any heavy machinery. If there is extra please dispose in a proper manner. Please do not take extra Tylenol for this can lead to Tylenol overdose and liver issues. Please continue to monitor symptoms closely if symptoms are to worsen or change (fever greater than 101, chills, numbness, tingling, fall, injury, worsening or changes to the finger) please report back to the ED immediately   Finger Fracture Fractures of fingers are breaks in the bones of the fingers. There are many types of fractures. There are different ways of treating these fractures. Your health care provider will discuss the best way to treat your fracture. CAUSES Traumatic injury is the main cause of broken fingers. These include:  Injuries while playing sports.  Workplace injuries.  Falls. RISK FACTORS Activities that can increase your risk of finger fractures include:  Sports.  Workplace activities that involve machinery.  A condition called osteoporosis, which can make your bones less dense and cause them to fracture more easily. SIGNS AND SYMPTOMS The main symptoms of a broken finger are pain and swelling within 15 minutes after the injury. Other symptoms include:  Bruising of your finger.  Stiffness of your finger.  Numbness of your finger.  Exposed bones (compound fracture) if the fracture is severe. DIAGNOSIS  The best way to diagnose a broken bone is with X-ray imaging. Additionally, your health care provider will use this X-ray image to evaluate the position of the broken finger bones.  TREATMENT  Finger fractures can be  treated with:   Nonreduction This means the bones are in place. The finger is splinted without changing the positions of the bone pieces. The splint is usually left on for about a week to 10 days. This will depend on your fracture and what your health care provider thinks.  Closed reduction The bones are put back into position without using surgery. The finger is then splinted.  Open reduction and internal fixation The fracture site is opened. Then the bone pieces are fixed into place with pins or some type of hardware. This is seldom required. It depends on the severity of the fracture. HOME CARE INSTRUCTIONS   Follow your health care provider's instructions regarding activities, exercises, and physical therapy.  Only take over-the-counter or prescription medicines for pain, discomfort, or fever as directed by your health care provider. SEEK MEDICAL CARE IF: You have pain or swelling that limits the motion or use of your fingers. SEEK IMMEDIATE MEDICAL CARE IF:  Your finger becomes numb. MAKE SURE YOU:   Understand these instructions.  Will watch your condition.  Will get help right away if you are not doing well or get worse. Document Released: 02/07/2001 Document Revised: 08/16/2013 Document Reviewed: 06/07/2013 [facility] Patient Information 2014 [place], [place].   Cast or Splint Care Casts and splints support injured limbs and keep bones from moving while they heal.  HOME CARE  Keep the cast or splint uncovered during the drying period.  A plaster cast can take 24 to 48 hours to dry.  A fiberglass cast will dry in less than 1 hour.  Do not rest the cast on anything harder than a pillow for 24 hours.  Do not put weight on your injured limb. Do not put pressure on the cast. Wait for your doctor's approval.  Keep the cast or splint dry.  Cover the cast or splint with a plastic bag during baths or wet weather.  If you have a cast over your chest and belly (trunk), take  sponge baths until the cast is taken off.  If your cast gets wet, dry it with a towel or blow dryer. Use the cool setting on the blow dryer.  Keep your cast or splint clean. Wash a dirty cast with a damp cloth.  Do not put any objects under your cast or splint.  Do not scratch the skin under the cast with an object. If itching is a problem, use a blow dryer on a cool setting over the itchy area.  Do not trim or cut your cast.  Do not take out the padding from inside your cast.  Exercise your joints near the cast as told by your doctor.  Raise (elevate) your injured limb on 1 or 2 pillows for the first 1 to 3 days. GET HELP IF:  Your cast or splint cracks.  Your cast or splint is too tight or too loose.  You itch badly under the cast.  Your cast gets wet or has a soft spot.  You have a bad smell coming from the cast.  You get an object stuck under the cast.  Your skin around the cast becomes red or sore.  You have new or more pain after the cast is put on. GET HELP RIGHT AWAY IF:  You have fluid leaking through the cast.  You cannot move your fingers or toes.  Your fingers or toes turn blue or white or are cool, painful, or puffy (swollen).  You have tingling or lose feeling (numbness) around the injured area.  You have bad pain or pressure under the cast.  You have trouble breathing or have shortness of breath.  You have chest pain. Document Released: 02/25/2011 Document Revised: 06/28/2013 Document Reviewed: 05/04/2013 [facility] Patient Information 2014 [place], [place].   Emergency Department Resource Guide 1) Find a Doctor and Pay Out of Pocket Although you won't have to find out who is covered by your insurance plan, it is a good idea to ask around and get recommendations. You will then need to call the office and see if the doctor you have chosen will accept you as a new patient and what types of options they offer for patients who are self-pay. Some doctors  offer discounts or will set up payment plans for their patients who do not have insurance, but you will need to ask so you aren't surprised when you get to your appointment.  2) Contact Your Local Health Department Not all health departments have doctors that can see patients for sick visits, but many do, so it is worth a call to see if yours does. If you don't know where your local health department is, you can check in your phone book. The CDC also has a tool to help you locate your state's health department, and many state websites also have listings of all of their local health departments.  3) Find a Walk-in Clinic If your illness is not likely to be very severe or complicated, you may want to try a walk in clinic. These are popping up all over the country in pharmacies, drugstores, and shopping centers. They're usually staffed by nurse practitioners or physician assistants that have been  trained to treat common illnesses and complaints. They're usually fairly quick and inexpensive. However, if you have serious medical issues or chronic medical problems, these are probably not your best option.  No Primary Care Doctor: - Call Health Connect at  [phone] - they can help you locate a primary care doctor that  accepts your insurance, provides certain services, etc. - Physician Referral Service- [phone]  Chronic Pain Problems: Organization         Address  Phone   Notes  [facility]  [phone] Patients need to be referred by their primary care doctor.   Medication Assistance: Organization         Address  Phone   Notes  [facility] [address] [place] [place], [place] [address] [phone] --Must be a resident of [facility] -- Must have NO insurance coverage whatsoever (no Medicaid/ Medicare, etc.) -- The pt. MUST have a primary care doctor that directs their care regularly and follows them in the community    MedAssist  [phone]   [employer]  [phone]    Agencies that provide inexpensive medical care: Organization         Address  Phone   Notes  [facility]  [phone]   [facility]    [phone]   [facility] [address] [employer] [place], [place] [address] [phone]   [facility] [address], [place] [phone]   Planned Parenthood    [phone]   [facility]    [phone]   Community [facility] and [facility]  [address], [place] Phone:  [phone], Fax:  [phone] Hours of Operation:  9 am - 6 pm, M-F.  Also accepts Medicaid/Medicare and self-pay.  [facility]  [address], [place] Phone: [phone], Fax: [phone]. Hours of Operation:  8:30 am - 5:30 pm, M-F.  Also accepts Medicaid and self-pay.  [facility] [place] [address], [place] Phone: [phone]   [facility] [address] Natasha Bence [place], [place] [phone], Ext. [phone] Mondays & Thursdays: 7-9 AM.  First 15 patients are seen on a first come, first serve basis.    Medicaid-accepting [facility] In[facility] Providers:  Organization         Address  Phone   Notes  [facility] [address], [place] [phone] Also accepts self-pay patients.  [facility] [address] Laurell Josephs [place], [place]  [phone]   [facility] [address], [place] [phone]   [facility] [address] [place] [phone]   Renaye Rakers [address], [place]   [phone] Only accepts [place] Access [place] patients after they have their name applied to their card.   Self-Pay (no insurance) in [facility]:  Organization         Address  Phone   Notes  Sickle Cell Patients, [facility] Internal  Medicine [address] [place], [place] [phone]   [facility] [address] [place], [place] [phone]   [facility] [place]  [address], [place] [phone]   [facility]/Dr. Osei-Bonsu  [address], [place] or [place]  [address], [place] [phone] Phone number for both [facility] and [place] locations is the same.  [facility] [address], [place] [phone]   [facility] [address] [place] [address], [place] or [address] [phone]   [facility] [address], [place] [phone], phone; [phone], fax Sees patients 1st and 3rd Saturday of every month.  Must not qualify for public or private insurance (i.e. Medicaid, Medicare, [place] Health Choice, Veterans' Benefits)  Household income should be no more than 200% of the poverty level The clinic cannot treat you if you are pregnant or think you are pregnant  Sexually transmitted diseases are not treated at the clinic.    Dental Care: Organization         Address  Phone  Notes  [facility] [address], [place] [phone]) [phone] Accepts children up to age 24 who are enrolled in [place] or [place] Health Choice; pregnant women with a Medicaid card; and children who have applied for Medicaid or [place] Health Choice, but were declined, whose parents can pay a reduced fee at time of service.  [facility] [place]  [address], [place] [phone] Accepts children up to age 621 who are enrolled in [place] or [place] Health Choice; pregnant women with a Medicaid card; and children who have applied for Medicaid or [place] Health Choice, but were declined, whose parents can pay a reduced fee at time of service.  [facility]  [address], [place] [phone] Patients are seen by appointment only. Walk-ins are not accepted. [facility] will see patients 24 years of age and older. Monday - Tuesday (8am-5pm) Most Wednesdays (8:30-5pm) $30 per visit, cash only  [facility]  [address], [facility] [phone] Patients are seen by appointment only. Walk-ins are not accepted. [facility] will see patients 24 years of age and older. One Wednesday Evening (Monthly: Volunteer Based).  $30 per visit, cash only  [employer] [facility]  [phone] for adults; Children under age 754, call Graduate Pediatric Dentistry at [phone]. Children aged 874-14, please call [phone] to request a pediatric application.  Dental services are provided in all areas of dental care including fillings, crowns and bridges, complete and partial dentures, implants, gum treatment, root canals, and extractions. Preventive care is also provided. Treatment is provided to both adults and children. Patients are selected via a lottery and there is often a waiting list.   [facility] [address], [place]  [phone] [email or web address]   [facility] [address], [place], [place] [phone], Ext. [phone] Second and Fourth Thursday of each month, opens at 6:30 AM; Clinic ends at 9 AM.  Patients are seen on a first-come first-served basis, and a limited number are seen during each clinic.   [facility]  [address], [place], [place] [phone]   Eligibility Requirements You must have lived in [place], [place], or [place] counties for at least the last three months.   You cannot be eligible for state or federal sponsored [place] Cityhealthcare insurance, including CIGNAVeterans Administration, [place]icaid, or [employer]icare.   You generally cannot be eligible for healthcare insurance through your employer.    How to apply: Eligibility screenings are held  every Tuesday and  Wednesday afternoon from 1:00 pm until 4:00 pm. You do not need an appointment for the interview!  [facility] [address] [place], [place] [phone]   [facility] Health Department  [phone]   [facility] Health Department  [phone]   [facility] Health Department  [phone]    Behavioral Health Resources in the Community: Intensive Outpatient Programs Organization         Address  Phone  Notes  [facility] [address], [place], [place] [phone]   [facility] Outpatient [address], [place], [place] [phone]   [facility] [address], [place], [place]  [phone]   [facility] Mental Health [address]  [place], [place] [phone] or [phone]   Substance Abuse Resources Organization         Address  Phone  Notes  [facility] Drug Services  [phone]   [facility]  [phone]   [facility]  [phone]   Floydene Flock  [phone]   Residential & Outpatient Substance Abuse Program  3072166715   Psychological Services Organization         Address  Phone  Notes  [facility]  [phone]   [facility]  [phone]   [facility] Mental Health [address], [place] [phone] or [phone]    Mobile Crisis Teams Organization         Address  Phone  Notes  [facility], Mobile Crisis Care Unit  [phone]   [facility]  [address] [place], [place] [phone]   Doristine Locks [address] [place] [phone]    Self-Help/Support Groups Organization         Address  Phone             Notes  [facility] [place] - variety of support groups  336- I7437963 Call for more information  Narcotics Anonymous (NA), [facility] [address], [employer] [place]  2 meetings at this location   Risk manager         Address  Phone  Notes  [facility] [address],    [place]  [phone]   [facility]  [address] [place] [address], [place], [place] [phone]   [facility] Treatment Facility [address] [place], [place] [phone] Admissions: 8am-3pm M-F  [facility] [address],    [place], [place] [phone]   [facility] [address] [place], [place], [place] [phone]   [facility] [address]  [place], [place] [phone]   [facility] - Intensive Outpatient [address], [place], [place] [phone]   [facility] ([facility] [address] [place]  [place], [place] [phone] or [phone]   [facility] ([facility]) [address], [place], [place] [phone] Accepts Medicaid  [facility] [address]  [place] [phone] Substance Abuse/Addiction Treatment   [place] [facility] Organization         Address  Phone  Notes  [facility]  [phone]   Angie Fava, PhD [address] Ervin Knack [place], [place]   [phone] or [phone]   [facility] Behavioral   [address] [place], [place] [phone]   [facility] [address], [place], [place] [phone] Insurance/Medicaid/sponsorship through  [facility]  [facility] [address]                                    [place], [place] [phone] Therapy/tele-psych/case  [facility] [address]   [place], [place] [phone]    Dr. Lolly Mustache  [phone]   [facility]  [employer] [facility] Dept. 1) [address] [place] 2) [address] [place] 3)  [address], [place] [phone]  [phone]   [facility] Child Abuse Hotline 213-072-3400 or [phone] (After Hours)

## 2014-01-17 NOTE — ED Provider Notes (Signed)
CSN: [id number]     Arrival date & time 01/17/14  0919 History  This chart was scribed for non-physician practitioner, Raymon Mutton, PA-C,working with Flint Melter, MD, by Karle Plumber, ED Scribe.  This patient was seen in room TR06C/TR06C and the patient's care was started at 9:38 AM.  Chief Complaint  Patient presents with  . Fall   The history is provided by the patient. No language interpreter was used.   HPI Comments:  Cassandra Russell is a 23 y.o. female who presents to the Emergency Department complaining of an injury to her left fifth digit secondary to slipping and falling on mud PTA. Pt states she caught herself with her hands flexed. Pt reports associated tingling to the digit. She states movement of the finger makes the pain worse. She denies taking anything for pain PTA. Pt denies numbness or loss of sensation. She denies prior injury to the finger. She denies LOC, head injury, or wrist pain or injury.    Past Medical History  Diagnosis Date  . Chlamydia   . Conjunctivitis    Past Surgical History  Procedure Laterality Date  . Intrauterine device insertion  2009   Family History  Problem Relation Age of Onset  . Hypertension Mother    History  Substance Use Topics  . Smoking status: Never Smoker   . Smokeless tobacco: Never Used  . Alcohol Use: No   OB History   Grav Para Term Preterm Abortions TAB SAB Ect Mult Living   1 1        1      Review of Systems  Musculoskeletal: Positive for arthralgias (left fifth digit pain).  Neurological: Negative for syncope and numbness.  All other systems reviewed and are negative.   Allergies  Review of patient's allergies indicates no known allergies.  Home Medications   Current Outpatient Rx  Name  Route  Sig  Dispense  Refill  . oxyCODONE-acetaminophen (PERCOCET/ROXICET) 5-325 MG per tablet   Oral   Take 1 tablet by mouth every 8 (eight) hours as needed for severe pain.   7 tablet   0    Triage Vitals:  BP 117/83  Pulse 114  Temp(Src) 98.5 F (36.9 C) (Oral)  Resp 18  Ht 5' (1.524 m)  Wt 140 lb (63.504 kg)  BMI 27.34 kg/m2  SpO2 100% Physical Exam  Nursing note and vitals reviewed. Constitutional: She is oriented to person, place, and time. She appears well-developed and well-nourished. No distress.  HENT:  Head: Normocephalic and atraumatic.  Mouth/Throat: Oropharynx is clear and moist. No oropharyngeal exudate.  Negative signs of facial trauma  Eyes: Conjunctivae and EOM are normal. Pupils are equal, round, and reactive to light. Right eye exhibits no discharge. Left eye exhibits no discharge.  Neck: Normal range of motion. Neck supple. No tracheal deviation present.  Cardiovascular: Normal rate, regular rhythm and normal heart sounds.  Exam reveals no friction rub.   No murmur heard. Pulses:      Radial pulses are 2+ on the right side, and 2+ on the left side.  Cap refill less than 3 seconds  Pulmonary/Chest: Effort normal and breath sounds normal. No respiratory distress. She has no wheezes. She has no rales.  Musculoskeletal: She exhibits tenderness.  Mild swelling noted to the left pinky finger with discomfort upon palpation. Flexion at the DIP joint noted with discomfort upon extension of the DIP. Discomfort upon palpation to the left pinky circumferentially, mainly of the distal aspect. Negative  deformities identified. Full range of motion noted to the remaining digits of the left hand.  Lymphadenopathy:    She has no cervical adenopathy.  Neurological: She is alert and oriented to person, place, and time. No cranial nerve deficit. She exhibits normal muscle tone. Coordination normal.  Cranial nerves III-XII grossly intact Sensation intact with differentiation to sharp and dull touch to the left hand Strength intact to MCP, PIP, DIP joints of left hand  Skin: Skin is warm and dry. No rash noted. She is not diaphoretic. No erythema.  Psychiatric: She has a normal mood and  affect. Her behavior is normal.    ED Course  Procedures (including critical care time) DIAGNOSTIC STUDIES: Oxygen Saturation is 100% on RA, normal by my interpretation.   COORDINATION OF CARE: 9:42 AM- Will X-Ray left hand. Pt verbalizes understanding and agrees to plan.  Medications - No data to display  Dg Hand Complete Left  01/17/2014   CLINICAL DATA Fall.  EXAM LEFT HAND - COMPLETE 3+ VIEW  COMPARISON None.  FINDINGS Oblique fracture is noted of the mid to distal aspect of the middle phalanx of the left fifth digit. No other focal abnormalities are identified. No evidence of dislocation.  IMPRESSION Oblique fracture of the middle phalanx of the left fifth digit.  SIGNATURE  Electronically Signed   By: Maisie Fushomas  Register   On: 01/17/2014 09:57    Labs Review Labs Reviewed - No data to display Imaging Review Dg Hand Complete Left  01/17/2014   CLINICAL DATA Fall.  EXAM LEFT HAND - COMPLETE 3+ VIEW  COMPARISON None.  FINDINGS Oblique fracture is noted of the mid to distal aspect of the middle phalanx of the left fifth digit. No other focal abnormalities are identified. No evidence of dislocation.  IMPRESSION Oblique fracture of the middle phalanx of the left fifth digit.  SIGNATURE  Electronically Signed   By: Maisie Fushomas  Register   On: 01/17/2014 09:57     EKG Interpretation None      MDM   Final diagnoses:  Fracture of finger, middle phalanx, left, closed    Filed Vitals:   01/17/14 0931 01/17/14 1116  BP: 117/83 108/70  Pulse: 114 68  Temp: 98.5 F (36.9 C)   TempSrc: Oral   Resp: 18 20  Height: 5' (1.524 m)   Weight: 140 lb (63.504 kg)   SpO2: 100%     I personally performed the services described in this documentation, which was scribed in my presence. The recorded information has been reviewed and is accurate.  Patient presenting to the ED with left pinky pain that started this morning after the patient fell when sliding on Monday. Patient reported that she put  her left hand out to try and catch herself-stated that she landed on her left pinky in a weird manner and resulted in left pinky pain described as a throbbing sensation that is constant without radiation. Patient reports that there is a mild tingling sensation. Stated that the pain is worse with extension of the left pinky finger. Alert and oriented. GCS 15. Heart rate and rhythm normal. Lungs clear to auscultation to upper and lower lobes bilaterally. Radial pulses 2+ bilaterally. Negative deformities identified to the left pinky finger. Mild swelling identified circumferentially to the distal aspect of the left pinky finger with discomfort upon palpation. Patient able to flex and extend, minimal extension secondary to pain in the left pinky finger. Cap refill less than 3 seconds. Sensation intact with differentiation to sharp  and dull touch. Strength intact to MCP, PIP, DIP joints of the left hand. Left hand plain film identified oblique fracture of the middle phalanx of the left fifth digit. Patient presenting to the ED with a closed fracture of the left fifth digit. Patient placed in splint. Patient neurovascularly intact. Negative focal neurological deficits identified. Patient stable, afebrile. Discharged patient. Referred patient to hand specialist. Discussed with patient proper care splints. Discussed with patient to avoid any physical or strenuous activity. Discussed with patient to rest, ice, elevate. Discharge patient with small dose of pain medications-discussed course, precautions, disposal technique. Discussed with patient to closely monitor symptoms and if symptoms are to worsen or change to report back to the ED - strict return instructions given.  Patient agreed to plan of care, understood, all questions answered.   Raymon Mutton, PA-C 01/17/14 1722

## 2014-01-17 NOTE — ED Notes (Signed)
Patient states had a mechanical fall when she slipped on mud this morning.  Patient landed on L 5th finger.   Swelling and pain at site.    Patient denies any other injury.

## 2014-01-18 ENCOUNTER — Encounter (HOSPITAL_COMMUNITY): Payer: Self-pay | Admitting: Pharmacy Technician

## 2014-01-19 NOTE — ED Provider Notes (Signed)
Medical screening examination/treatment/procedure(s) were performed by non-physician practitioner and as supervising physician I was immediately available for consultation/collaboration.   EKG Interpretation None       Flint MelterElliott L Sharnise Blough, MD 01/19/14 1057

## 2014-01-22 ENCOUNTER — Encounter (HOSPITAL_COMMUNITY): Payer: Self-pay

## 2014-01-22 ENCOUNTER — Encounter (HOSPITAL_COMMUNITY)
Admission: RE | Admit: 2014-01-22 | Discharge: 2014-01-22 | Disposition: A | Discharge status: Home / Self Care | Payer: Medicaid Other | Source: Ambulatory Visit | Attending: Orthopedic Surgery | Admitting: Orthopedic Surgery

## 2014-01-22 DIAGNOSIS — Z01812 Encounter for preprocedural laboratory examination: Secondary | ICD-10-CM | POA: Insufficient documentation

## 2014-01-22 HISTORY — DX: Supraventricular tachycardia: I47.1

## 2014-01-22 LAB — CBC
HCT: 40.1 % (ref 36.0–46.0)
Hemoglobin: 13.8 g/dL (ref 12.0–15.0)
MCH: 30.2 pg (ref 26.0–34.0)
MCHC: 34.4 g/dL (ref 30.0–36.0)
MCV: 87.7 fL (ref 78.0–100.0)
Platelets: 279 10*3/uL (ref 150–400)
RBC: 4.57 MIL/uL (ref 3.87–5.11)
RDW: 12 % (ref 11.5–15.5)
WBC: 6.5 10*3/uL (ref 4.0–10.5)

## 2014-01-22 LAB — HCG, SERUM, QUALITATIVE: PREG SERUM: NEGATIVE

## 2014-01-22 NOTE — Progress Notes (Signed)
Pt not arrived for PAT, mobile number called and voice mail left. Instructed pt to call if unable to come.

## 2014-01-22 NOTE — Progress Notes (Signed)
No orders on PAT day. Dr. Glenna Durandrtmann's office called by Cicero DuckErika whom reported being told that Dr. Melvyn Novasrtmann had been on vacation but returned today and would be informed that pt needed orders.

## 2014-01-22 NOTE — Pre-Procedure Instructions (Addendum)
Cassandra CossLeslie A Russell  01/22/2014   Your procedure is scheduled on:  Wednesday, January 24, 2014  [facility] [address] [facility] [phone] AT 8:00 AM ON Wednesday MORNING (01/24/14) TO SEE WHAT TIME YOUR SURGERY IS SCHEDULED FOR   Call this number if you have problems the morning of surgery: [phone]   Remember:   Do not eat food or drink liquids after midnight.   Take these medicines the morning of surgery with A SIP OF WATER: OXYCODONE-ACETAMINOPHEN (PERCOCET)  STOP taking Aspirin, Goody's, BC's, Aleve (Naproxen), Ibuprofen (Advil or Motrin), Fish Oil, Vitamins, Herbal Supplements or any substance that could thin your blood starting today, Monday, 01/22/14   Do not wear jewelry, make-up or nail polish.  Do not wear lotions, powders, or perfumes.  Do not shave 48 hours prior to surgery.  Do not bring valuables to the hospital.  [facility] is not responsible                  for any belongings or valuables.               Contacts, dentures or bridgework may not be worn into surgery.  Leave suitcase in the car. After surgery it may be brought to your room.  For patients admitted to the hospital, discharge time is determined by your                treatment team.               Patients discharged the day of surgery will not be allowed to drive  home.    Special Instructions: Please use CHG soap the night before surgery and the day of surgery. CHG soap should be used atleast twice.   Please read over the following fact sheets that you were given: Pain Booklet, Coughing and Deep Breathing and Surgical Site Infection Prevention

## 2014-01-24 ENCOUNTER — Encounter (HOSPITAL_COMMUNITY)
Admission: AD | Disposition: A | Discharge status: Home / Self Care | Payer: Self-pay | Source: Ambulatory Visit | Attending: Orthopedic Surgery

## 2014-01-24 ENCOUNTER — Encounter (HOSPITAL_COMMUNITY): Payer: Medicaid Other | Admitting: Anesthesiology

## 2014-01-24 ENCOUNTER — Ambulatory Visit (HOSPITAL_COMMUNITY): Payer: Medicaid Other | Admitting: Anesthesiology

## 2014-01-24 ENCOUNTER — Encounter (HOSPITAL_COMMUNITY): Payer: Self-pay | Admitting: *Deleted

## 2014-01-24 ENCOUNTER — Ambulatory Visit (HOSPITAL_COMMUNITY)
Admission: AD | Admit: 2014-01-24 | Discharge: 2014-01-24 | Disposition: A | Discharge status: Home / Self Care | Payer: Medicaid Other | Source: Ambulatory Visit | Attending: Orthopedic Surgery | Admitting: Orthopedic Surgery

## 2014-01-24 DIAGNOSIS — IMO0002 Reserved for concepts with insufficient information to code with codable children: Secondary | ICD-10-CM | POA: Insufficient documentation

## 2014-01-24 DIAGNOSIS — W010XXA Fall on same level from slipping, tripping and stumbling without subsequent striking against object, initial encounter: Secondary | ICD-10-CM | POA: Insufficient documentation

## 2014-01-24 HISTORY — PX: CLOSED REDUCTION FINGER WITH PERCUTANEOUS PINNING: SHX5612

## 2014-01-24 SURGERY — CLOSED REDUCTION, FINGER, WITH PERCUTANEOUS PINNING
Anesthesia: General | Site: Finger | Laterality: Left

## 2014-01-24 MED ORDER — CEFAZOLIN SODIUM 1-5 GM-% IV SOLN: 1.0000 g | Freq: Once | INTRAVENOUS | Status: DC

## 2014-01-24 MED ORDER — SODIUM CHLORIDE 0.9 % IR SOLN
Status: DC | PRN
  Administered 2014-01-24: 1000 mL

## 2014-01-24 MED ORDER — MIDAZOLAM HCL 5 MG/5ML IJ SOLN
INTRAMUSCULAR | Status: DC | PRN
  Administered 2014-01-24: 1 mg via INTRAVENOUS

## 2014-01-24 MED ORDER — ONDANSETRON HCL 4 MG/2ML IJ SOLN
INTRAMUSCULAR | Status: DC | PRN
  Administered 2014-01-24: 4 mg via INTRAVENOUS

## 2014-01-24 MED ORDER — HYDROMORPHONE HCL PF 1 MG/ML IJ SOLN
INTRAMUSCULAR | Status: AC
  Filled 2014-01-24: qty 1

## 2014-01-24 MED ORDER — MIDAZOLAM HCL 2 MG/2ML IJ SOLN
INTRAMUSCULAR | Status: AC
  Filled 2014-01-24: qty 2

## 2014-01-24 MED ORDER — LACTATED RINGERS IV SOLN
INTRAVENOUS | Status: DC
  Administered 2014-01-24: 15:00:00 via INTRAVENOUS

## 2014-01-24 MED ORDER — HYDROMORPHONE HCL PF 1 MG/ML IJ SOLN
0.5000 mg | Freq: Once | INTRAMUSCULAR | Status: AC
  Administered 2014-01-24: 0.5 mg via INTRAVENOUS

## 2014-01-24 MED ORDER — ONDANSETRON HCL 4 MG/2ML IJ SOLN
INTRAMUSCULAR | Status: AC
  Filled 2014-01-24: qty 2

## 2014-01-24 MED ORDER — CEFAZOLIN SODIUM-DEXTROSE 2-3 GM-% IV SOLR
2.0000 g | INTRAVENOUS | Status: DC
  Filled 2014-01-24: qty 50

## 2014-01-24 MED ORDER — LIDOCAINE HCL (CARDIAC) 20 MG/ML IV SOLN
INTRAVENOUS | Status: AC
  Filled 2014-01-24: qty 5

## 2014-01-24 MED ORDER — OXYCODONE-ACETAMINOPHEN 5-325 MG PO TABS: 1.0000 | ORAL_TABLET | ORAL | Status: DC | PRN

## 2014-01-24 MED ORDER — DOCUSATE SODIUM 100 MG PO CAPS: 100.0000 mg | ORAL_CAPSULE | Freq: Two times a day (BID) | ORAL | Status: DC

## 2014-01-24 MED ORDER — LIDOCAINE HCL (CARDIAC) 20 MG/ML IV SOLN
INTRAVENOUS | Status: DC | PRN
  Administered 2014-01-24: 60 mg via INTRAVENOUS
  Administered 2014-01-24: 40 mg via INTRAVENOUS

## 2014-01-24 MED ORDER — FENTANYL CITRATE 0.05 MG/ML IJ SOLN
INTRAMUSCULAR | Status: AC
  Filled 2014-01-24: qty 5

## 2014-01-24 MED ORDER — FENTANYL CITRATE 0.05 MG/ML IJ SOLN
INTRAMUSCULAR | Status: DC | PRN
  Administered 2014-01-24: 100 ug via INTRAVENOUS

## 2014-01-24 MED ORDER — PROPOFOL 10 MG/ML IV BOLUS
INTRAVENOUS | Status: AC
  Filled 2014-01-24: qty 20

## 2014-01-24 MED ORDER — PROPOFOL 10 MG/ML IV BOLUS
INTRAVENOUS | Status: DC | PRN
  Administered 2014-01-24: 160 mg via INTRAVENOUS

## 2014-01-24 MED ORDER — LACTATED RINGERS IV SOLN
INTRAVENOUS | Status: DC | PRN
  Administered 2014-01-24: 17:00:00 via INTRAVENOUS

## 2014-01-24 MED ORDER — CEFAZOLIN SODIUM 1-5 GM-% IV SOLN
INTRAVENOUS | Status: AC
  Administered 2014-01-24: 1 g via INTRAVENOUS
  Filled 2014-01-24: qty 50

## 2014-01-24 SURGICAL SUPPLY — 33 items
APL SKNCLS STERI-STRIP NONHPOA (GAUZE/BANDAGES/DRESSINGS)
BANDAGE ELASTIC 3 VELCRO ST LF (GAUZE/BANDAGES/DRESSINGS) ×3 IMPLANT
BANDAGE ELASTIC 4 VELCRO ST LF (GAUZE/BANDAGES/DRESSINGS) IMPLANT
BANDAGE GAUZE ELAST BULKY 4 IN (GAUZE/BANDAGES/DRESSINGS) ×3 IMPLANT
BENZOIN TINCTURE PRP APPL 2/3 (GAUZE/BANDAGES/DRESSINGS) IMPLANT
BLADE SURG ROTATE 9660 (MISCELLANEOUS) IMPLANT
CLOSURE WOUND 1/2 X4 (GAUZE/BANDAGES/DRESSINGS)
COVER SURGICAL LIGHT HANDLE (MISCELLANEOUS) ×3 IMPLANT
CUFF TOURNIQUET SINGLE 18IN (TOURNIQUET CUFF) ×2 IMPLANT
CUFF TOURNIQUET SINGLE 24IN (TOURNIQUET CUFF) IMPLANT
DRAPE OEC MINIVIEW 54X84 (DRAPES) ×3 IMPLANT
DRSG EMULSION OIL 3X3 NADH (GAUZE/BANDAGES/DRESSINGS) IMPLANT
GAUZE XEROFORM 1X8 LF (GAUZE/BANDAGES/DRESSINGS) IMPLANT
GLOVE BIOGEL PI IND STRL 8.5 (GLOVE) ×1 IMPLANT
GLOVE BIOGEL PI INDICATOR 8.5 (GLOVE) ×2
GLOVE SURG ORTHO 8.0 STRL STRW (GLOVE) ×3 IMPLANT
GOWN STRL REUS W/ TWL LRG LVL3 (GOWN DISPOSABLE) ×2 IMPLANT
GOWN STRL REUS W/TWL LRG LVL3 (GOWN DISPOSABLE) ×6
KIT BASIN OR (CUSTOM PROCEDURE TRAY) ×3 IMPLANT
KIT ROOM TURNOVER OR (KITS) ×3 IMPLANT
NS IRRIG 1000ML POUR BTL (IV SOLUTION) ×3 IMPLANT
PACK ORTHO EXTREMITY (CUSTOM PROCEDURE TRAY) ×3 IMPLANT
PAD ARMBOARD 7.5X6 YLW CONV (MISCELLANEOUS) ×6 IMPLANT
SPONGE GAUZE 4X4 12PLY (GAUZE/BANDAGES/DRESSINGS) IMPLANT
STRIP CLOSURE SKIN 1/2X4 (GAUZE/BANDAGES/DRESSINGS) IMPLANT
SUT ETHILON 4 0 P 3 18 (SUTURE) IMPLANT
SUT ETHILON 5 0 P 3 18 (SUTURE)
SUT NYLON ETHILON 5-0 P-3 1X18 (SUTURE) IMPLANT
SUT PROLENE 4 0 P 3 18 (SUTURE) IMPLANT
TOWEL OR 17X24 6PK STRL BLUE (TOWEL DISPOSABLE) ×3 IMPLANT
TOWEL OR 17X26 10 PK STRL BLUE (TOWEL DISPOSABLE) ×3 IMPLANT
WATER STERILE IRR 1000ML POUR (IV SOLUTION) ×1 IMPLANT
k wire ×2 IMPLANT

## 2014-01-24 NOTE — H&P (Signed)
Cassandra Russell is an 23 y.o. female.   Chief Complaint: left small finger fracture HPI: Pt sustained closed injury to left small finger Presented to office with deformity and swollen left small finger Pt here for surgery No prior history of surgery to left hand  Past Medical History  Diagnosis Date  . Chlamydia   . Conjunctivitis   . SVT (supraventricular tachycardia) 10/2013    Adenosine was given in the ED ([facility]), pt was not admitted.    Past Surgical History  Procedure Laterality Date  . Intrauterine device insertion  2009  . Wisdom tooth extraction      Family History  Problem Relation Age of Onset  . Hypertension Mother    Social History:  reports that she has never smoked. She has never used smokeless tobacco. She reports that she does not drink alcohol or use illicit drugs.  Allergies: No Known Allergies  Medications Prior to Admission  Medication Sig Dispense Refill  . ibuprofen (ADVIL,MOTRIN) 200 MG tablet Take 200 mg by mouth.      . oxyCODONE-acetaminophen (PERCOCET/ROXICET) 5-325 MG per tablet Take 1 tablet by mouth every 8 (eight) hours as needed for severe pain.  7 tablet  0    No results found for this or any previous visit (from the past 48 hour(s)). No results found.  ROS  No RECENT ILLNESSES OR HOSPITALIZATIONS   Blood pressure 122/67, pulse 77, temperature 98.5 F (36.9 C), resp. rate 18, last menstrual period 11/29/2013, SpO2 100.00%. 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  PT VOICED UNDERSTANDING OF PLAN CONSENT SIGNED DAY OF SURGERY PT SEEN AND EXAMINED PRIOR TO OPERATIVE PROCEDURE/DAY OF SURGERY SITE MARKED. QUESTIONS ANSWERED [facility]  Sharma CovertORTMANN,Alica Shellhammer W 01/24/2014, 4:48 PM

## 2014-01-24 NOTE — Preoperative (Signed)
Beta Blockers   Reason not to administer Beta Blockers:Not Applicable 

## 2014-01-24 NOTE — Discharge Instructions (Signed)
KEEP BANDAGE CLEAN AND DRY CALL OFFICE FOR F/U APPT [phone] IN 10 DAYS DR Melvyn NovasTMANN CELL [phone] KEEP HAND ELEVATED ABOVE HEART OK TO APPLY ICE TO OPERATIVE AREA CONTACT OFFICE IF ANY WORSENING PAIN OR CONCERNS.

## 2014-01-24 NOTE — Transfer of Care (Signed)
Immediate Anesthesia Transfer of Care Note  Patient: Cassandra Russell  Procedure(s) Performed: Procedure(s): LEFT SMALL FINGER CLOSED REDUCTION WITH PINNING/POSSIBLE ORIF (Left)  Patient Location: PACU  Anesthesia Type:General  Level of Consciousness: awake, oriented, sedated and patient cooperative  Airway & Oxygen Therapy: Patient Spontanous Breathing and Patient connected to nasal cannula oxygen  Post-op Assessment: Report given to PACU RN and Post -op Vital signs reviewed and stable  Post vital signs: Reviewed and stable  Complications: No apparent anesthesia complications

## 2014-01-24 NOTE — Anesthesia Preprocedure Evaluation (Signed)
Anesthesia Evaluation  Patient identified by MRN, date of birth, ID band Patient awake    Reviewed: Allergy & Precautions  History of Anesthesia Complications Negative for: history of anesthetic complications  Airway Mallampati: I      Dental  (+) Teeth Intact   Pulmonary neg pulmonary ROS,    Pulmonary exam normal       Cardiovascular negative cardio ROS  Rhythm:Regular Rate:Normal     Neuro/Psych    GI/Hepatic negative GI ROS, Neg liver ROS,   Endo/Other  negative endocrine ROS  Renal/GU negative Renal ROS     Musculoskeletal negative musculoskeletal ROS (+)   Abdominal Normal abdominal exam  (+)   Peds negative pediatric ROS (+)  Hematology negative hematology ROS (+)   Anesthesia Other Findings   Reproductive/Obstetrics negative OB ROS                           Anesthesia Physical Anesthesia Plan  ASA: I  Anesthesia Plan: General   Post-op Pain Management:    Induction: Intravenous  Airway Management Planned: LMA  Additional Equipment:   Intra-op Plan:   Post-operative Plan:   Informed Consent:   Plan Discussed with: CRNA  Anesthesia Plan Comments:         Anesthesia Quick Evaluation

## 2014-01-24 NOTE — Brief Op Note (Signed)
01/24/2014  4:51 PM  PATIENT:  Cassandra Russell  23 y.o. female  PRE-OPERATIVE DIAGNOSIS:  LEFT SMALL FINGER MIDDLE PHALANX FRACTURE  POST-OPERATIVE DIAGNOSIS:  SAME  PROCEDURE:  Procedure(s): LEFT SMALL FINGER CLOSED REDUCTION WITH PINNING/POSSIBLE ORIF (Left)  SURGEON:  Surgeon(s) and Role:    * Sharma CovertFred W Nema Oatley, MD - Primary  PHYSICIAN ASSISTANT:   ASSISTANTS: none   ANESTHESIA:   general  EBL:     BLOOD ADMINISTERED:none  DRAINS: none   LOCAL MEDICATIONS USED: none  SPECIMEN:  No Specimen  DISPOSITION OF SPECIMEN:  N/A  COUNTS:  YES  TOURNIQUET:    DICTATION: .[id number]  PLAN OF CARE: Discharge to home after PACU  PATIENT DISPOSITION:  PACU - hemodynamically stable.   Delay start of Pharmacological VTE agent (>24hrs) due to surgical blood loss or risk of bleeding: not applicable

## 2014-01-24 NOTE — Anesthesia Procedure Notes (Signed)
Procedure Name: LMA Insertion Date/Time: 01/24/2014 5:18 PM Performed by: Coralee RudFLORES, Junko Ohagan Pre-anesthesia Checklist: Patient identified, Emergency Drugs available, Suction available and Patient being monitored Patient Re-evaluated:Patient Re-evaluated prior to inductionOxygen Delivery Method: Circle system utilized Preoxygenation: Pre-oxygenation with 100% oxygen Intubation Type: IV induction Ventilation: Mask ventilation without difficulty LMA: LMA inserted LMA Size: 3.0 Number of attempts: 1

## 2014-01-25 NOTE — Op Note (Signed)
Cassandra Russell, Cassandra Russell               ACCOUNT NO.:  [id number]  MEDICAL RECORD NO.:  [id number]  LOCATION:  [facility]                         FACILITY:  [facility]  PHYSICIAN:  Madelynn Done, MD  DATE OF BIRTH:  08-Jul-1991  DATE OF PROCEDURE:  01/24/2014 DATE OF DISCHARGE:  01/24/2014                              OPERATIVE REPORT   PREOPERATIVE DIAGNOSIS:  Left small finger middle phalanx fracture displaced.  POSTOPERATIVE DIAGNOSIS:  Left small finger middle phalanx fracture displaced.  ATTENDING PHYSICIAN:  Sharma Covert IV, MD, who scrubbed and present for the entire procedure.  FIRST ASSISTANT:  None.  SURGICAL PROCEDURE: 1. Closed reduction and percutaneous skeletal fixation unstable middle     phalanx fracture. 2. Radiographs 2 views, left small finger. 3. Surgical implants, two 0.035 K-wires.  SURGICAL INDICATIONS:  [name] is a right-hand dominant female who sustained a closed injury to her left small finger.  She presented with a malrotated small finger at the level of the distal phalanx, recommended that she undergo the above procedure.  Risks, benefits, and alternatives were discussed in detail with the patient and signed informed consent was obtained.  Risks of surgery to include, but not limited to bleeding, infection, damage to nearby nerves, arteries, or tendons, loss of motion of the wrist and digits, incomplete relief of symptoms and need for further surgical intervention and persistent malrotation.  DESCRIPTION OF PROCEDURE:  The patient was properly identified in the preoperative holding area and marked with a permanent marker made on the left small finger to indicate the correct operative site.  The patient was then brought back to the operating room, placed supine on the anesthesia table, where general anesthesia was administered.  The patient tolerated this well.  A well-padded tourniquet was placed on the left brachium sealed with 1000 drape.  The left  upper extremity was then prepped and draped in the normal sterile fashion.  Time-out was called. Correct site was identified and the procedure was begun.  Attention was then turned to the left small finger, where closed manipulation was performed rotating the finger.  Following this, two 0.035 K-wires were then placed across the fracture site with good purchase__________. These K-wires were then cut and bent, left hand.  The patient was then placed in a full composite flexion, achieved restoration in the alignment.  There was good cascading of the fingers.  The Xeroform was then applied around the fingers at the pin sites.  A sterile compressive bandage were then applied.  The patient was placed in a well-padded ulnar gutter splint, extubated, and taken to recovery room in good condition.  POSTPROCEDURE PLAN:  The patient was discharged home, seen back in the office approximately 10 days for x-rays in the splint and then will continue with the splint for a total of 4 weeks, as long as splint stays in place and then x-rays at each visit every 10 days and then pins out around the 4 week mark and then begin a gentle therapy regimen.  Radiographs 2 views of the finger do show the K-wire fixation in place in relatively good position.     Madelynn Done, MD  FWO/MEDQ  D:  01/24/2014  T:  01/25/2014  Job:  [id number]

## 2014-01-25 NOTE — Anesthesia Postprocedure Evaluation (Signed)
  Anesthesia Post-op Note  Patient: Cassandra CossLeslie A Malave  Procedure(s) Performed: Procedure(s): LEFT SMALL FINGER CLOSED REDUCTION WITH PINNING/POSSIBLE ORIF (Left)  Patient Location: PACU  Anesthesia Type:General  Level of Consciousness: awake, alert  and oriented  Airway and Oxygen Therapy: Patient Spontanous Breathing  Post-op Pain: mild  Post-op Assessment: Post-op Vital signs reviewed, Patient's Cardiovascular Status Stable, Respiratory Function Stable, Patent Airway, No signs of Nausea or vomiting and Pain level controlled  Post-op Vital Signs: Reviewed and stable  Complications: No apparent anesthesia complications

## 2014-01-26 ENCOUNTER — Encounter (HOSPITAL_COMMUNITY): Payer: Self-pay | Admitting: Orthopedic Surgery

## 2014-09-10 ENCOUNTER — Encounter: Payer: BC Managed Care – PPO | Admitting: Gynecology

## 2014-09-10 ENCOUNTER — Encounter (HOSPITAL_COMMUNITY): Payer: Self-pay | Admitting: Orthopedic Surgery

## 2014-10-01 ENCOUNTER — Encounter: Payer: Self-pay | Admitting: Gynecology

## 2014-10-01 ENCOUNTER — Ambulatory Visit (INDEPENDENT_AMBULATORY_CARE_PROVIDER_SITE_OTHER): Payer: Medicaid Other | Admitting: Gynecology

## 2014-10-01 ENCOUNTER — Other Ambulatory Visit (HOSPITAL_COMMUNITY)
Admission: RE | Admit: 2014-10-01 | Discharge: 2014-10-01 | Disposition: A | Discharge status: Home / Self Care | Payer: Medicaid Other | Source: Ambulatory Visit | Attending: Gynecology | Admitting: Gynecology

## 2014-10-01 VITALS — BP 120/70 | Ht 61.0 in | Wt 147.0 lb

## 2014-10-01 DIAGNOSIS — N911 Secondary amenorrhea: Secondary | ICD-10-CM | POA: Insufficient documentation

## 2014-10-01 DIAGNOSIS — Z01419 Encounter for gynecological examination (general) (routine) without abnormal findings: Secondary | ICD-10-CM | POA: Diagnosis present

## 2014-10-01 DIAGNOSIS — Z23 Encounter for immunization: Secondary | ICD-10-CM

## 2014-10-01 DIAGNOSIS — A499 Bacterial infection, unspecified: Secondary | ICD-10-CM

## 2014-10-01 DIAGNOSIS — Z113 Encounter for screening for infections with a predominantly sexual mode of transmission: Secondary | ICD-10-CM

## 2014-10-01 DIAGNOSIS — B9689 Other specified bacterial agents as the cause of diseases classified elsewhere: Secondary | ICD-10-CM

## 2014-10-01 DIAGNOSIS — N898 Other specified noninflammatory disorders of vagina: Secondary | ICD-10-CM

## 2014-10-01 DIAGNOSIS — Z124 Encounter for screening for malignant neoplasm of cervix: Secondary | ICD-10-CM

## 2014-10-01 DIAGNOSIS — N760 Acute vaginitis: Secondary | ICD-10-CM

## 2014-10-01 LAB — WET PREP FOR TRICH, YEAST, CLUE
TRICH WET PREP: NONE SEEN
WBC WET PREP: NONE SEEN
Yeast Wet Prep HPF POC: NONE SEEN

## 2014-10-01 LAB — PREGNANCY, URINE: PREG TEST UR: NEGATIVE

## 2014-10-01 MED ORDER — LEVONORGESTREL-ETHINYL ESTRAD 0.1-20 MG-MCG PO TABS: 1.0000 | ORAL_TABLET | Freq: Every day | ORAL | Status: DC

## 2014-10-01 MED ORDER — MEDROXYPROGESTERONE ACETATE 10 MG PO TABS: 10.0000 mg | ORAL_TABLET | Freq: Every day | ORAL | Status: DC

## 2014-10-01 NOTE — Patient Instructions (Addendum)
Influenza Virus Vaccine injection (Fluarix) What is this medicine? INFLUENZA VIRUS VACCINE (in floo EN zuh VAHY ruhs vak SEEN) helps to reduce the risk of getting influenza also known as the flu. This medicine may be used for other purposes; ask your health care provider or pharmacist if you have questions. COMMON BRAND NAME(S): Fluarix, Fluzone What should I tell my health care provider before I take this medicine? They need to know if you have any of these conditions: -bleeding disorder like hemophilia -fever or infection -Guillain-Barre syndrome or other neurological problems -immune system problems -infection with the human immunodeficiency virus (HIV) or AIDS -low blood platelet counts -multiple sclerosis -an unusual or allergic reaction to influenza virus vaccine, eggs, chicken proteins, latex, gentamicin, other medicines, foods, dyes or preservatives -pregnant or trying to get pregnant -breast-feeding How should I use this medicine? This vaccine is for injection into a muscle. It is given by a health care professional. A copy of Vaccine Information Statements will be given before each vaccination. Read this sheet carefully each time. The sheet may change frequently. Talk to your pediatrician regarding the use of this medicine in children. Special care may be needed. Overdosage: If you think you have taken too much of this medicine contact a poison control center or emergency room at once. NOTE: This medicine is only for you. Do not share this medicine with others. What if I miss a dose? This does not apply. What may interact with this medicine? -chemotherapy or radiation therapy -medicines that lower your immune system like etanercept, anakinra, infliximab, and adalimumab -medicines that treat or prevent blood clots like warfarin -phenytoin -steroid medicines like prednisone or cortisone -theophylline -vaccines This list may not describe all possible interactions. Give your  health care provider a list of all the medicines, herbs, non-prescription drugs, or dietary supplements you use. Also tell them if you smoke, drink alcohol, or use illegal drugs. Some items may interact with your medicine. What should I watch for while using this medicine? Report any side effects that do not go away within 3 days to your doctor or health care professional. Call your health care provider if any unusual symptoms occur within 6 weeks of receiving this vaccine. You may still catch the flu, but the illness is not usually as bad. You cannot get the flu from the vaccine. The vaccine will not protect against colds or other illnesses that may cause fever. The vaccine is needed every year. What side effects may I notice from receiving this medicine? Side effects that you should report to your doctor or health care professional as soon as possible: -allergic reactions like skin rash, itching or hives, swelling of the face, lips, or tongue Side effects that usually do not require medical attention (report to your doctor or health care professional if they continue or are bothersome): -fever -headache -muscle aches and pains -pain, tenderness, redness, or swelling at site where injected -weak or tired This list may not describe all possible side effects. Call your doctor for medical advice about side effects. You may report side effects to FDA at 1-800-FDA-1088. Where should I keep my medicine? This vaccine is only given in a clinic, pharmacy, doctor's office, or other health care setting and will not be stored at home. NOTE: This sheet is a summary. It may not cover all possible information. If you have questions about this medicine, talk to your doctor, pharmacist, or health care provider.  2015, Elsevier/Gold Standard. (2008-05-23 09:30:40) Oral Contraception Information Oral contraceptive  pills (OCPs) are medicines taken to prevent pregnancy. OCPs work by preventing the ovaries from  releasing eggs. The hormones in OCPs also cause the cervical mucus to thicken, preventing the sperm from entering the uterus. The hormones also cause the uterine lining to become thin, not allowing a fertilized egg to attach to the inside of the uterus. OCPs are highly effective when taken exactly as prescribed. However, OCPs do not prevent sexually transmitted diseases (STDs). Safe sex practices, such as using condoms along with the pill, can help prevent STDs.  Before taking the pill, you may have a physical exam and Pap test. Your health care provider may order blood tests. The health care provider will make sure you are a good candidate for oral contraception. Discuss with your health care provider the possible side effects of the OCP you may be prescribed. When starting an OCP, it can take 2 to 3 months for the body to adjust to the changes in hormone levels in your body.  TYPES OF ORAL CONTRACEPTION  The combination pill--This pill contains estrogen and progestin (synthetic progesterone) hormones. The combination pill comes in 21-day, 28-day, or 91-day packs. Some types of combination pills are meant to be taken continuously (365-day pills). With 21-day packs, you do not take pills for 7 days after the last pill. With 28-day packs, the pill is taken every day. The last 7 pills are without hormones. Certain types of pills have more than 21 hormone-containing pills. With 91-day packs, the first 84 pills contain both hormones, and the last 7 pills contain no hormones or contain estrogen only.  The minipill--This pill contains the progesterone hormone only. The pill is taken every day continuously. It is very important to take the pill at the same time each day. The minipill comes in packs of 28 pills. All 28 pills contain the hormone.  ADVANTAGES OF ORAL CONTRACEPTIVE PILLS  Decreases premenstrual symptoms.   Treats menstrual period cramps.   Regulates the menstrual cycle.   Decreases a heavy  menstrual flow.   May treatacne, depending on the type of pill.   Treats abnormal uterine bleeding.   Treats polycystic ovarian syndrome.   Treats endometriosis.   Can be used as emergency contraception.  THINGS THAT CAN MAKE ORAL CONTRACEPTIVE PILLS LESS EFFECTIVE OCPs can be less effective if:   You forget to take the pill at the same time every day.   You have a stomach or intestinal disease that lessens the absorption of the pill.   You take OCPs with other medicines that make OCPs less effective, such as antibiotics, certain HIV medicines, and some seizure medicines.   You take expired OCPs.   You forget to restart the pill on day 7, when using the packs of 21 pills.  RISKS ASSOCIATED WITH ORAL CONTRACEPTIVE PILLS  Oral contraceptive pills can sometimes cause side effects, such as:  Headache.  Nausea.  Breast tenderness.  Irregular bleeding or spotting. Combination pills are also associated with a small increased risk of:  Blood clots.  Heart attack.  Stroke. Document Released: 01/16/2003 Document Revised: 08/16/2013 Document Reviewed: 04/16/2013 [facility] Patient Information 2015 [place], [place]. This information is not intended to replace advice given to you by your health care provider. Make sure you discuss any questions you have with your health care provider. Tinidazole tablets What is this medicine? TINIDAZOLE (tye NI da zole) is an antiinfective. It is used to treat amebiasis, giardiasis, trichomoniasis, and vaginosis. It will not work for colds, flu, or  other viral infections. This medicine may be used for other purposes; ask your health care provider or pharmacist if you have questions. COMMON BRAND NAME(S): Tindamax What should I tell my health care provider before I take this medicine? They need to know if you have any of these conditions: -anemia or other blood disorders -if you frequently drink alcohol containing drinks -receiving  hemodialysis -seizure disorder -an unusual or allergic reaction to tinidazole, other medicines, foods, dyes, or preservatives -pregnant or trying to get pregnant -breast-feeding How should I use this medicine? Take this medicine by mouth with a full glass of water. Follow the directions on the prescription label. Take with food. Take your medicine at regular intervals. Do not take your medicine more often than directed. Take all of your medicine as directed even if you think you are better. Do not skip doses or stop your medicine early. Talk to your pediatrician regarding the use of this medicine in children. While this drug may be prescribed for children as young as 493 years of age for selected conditions, precautions do apply. Overdosage: If you think you have taken too much of this medicine contact a poison control center or emergency room at once. NOTE: This medicine is only for you. Do not share this medicine with others. What if I miss a dose? If you miss a dose, take it as soon as you can. If it is almost time for your next dose, take only that dose. Do not take double or extra doses. What may interact with this medicine? Do not take this medicine with any of the following medications: -alcohol or any product that contains alcohol -amprenavir oral solution -disulfiram -paclitaxel injection -ritonavir oral solution -sertraline oral solution -sulfamethoxazole-trimethoprim injection This medicine may also interact with the following medications: -cholestyramine -cimetidine -conivaptan -cyclosporin -fluorouracil -fosphenytoin, phenytoin -ketoconazole -lithium -phenobarbital -tacrolimus -warfarin This list may not describe all possible interactions. Give your health care provider a list of all the medicines, herbs, non-prescription drugs, or dietary supplements you use. Also tell them if you smoke, drink alcohol, or use illegal drugs. Some items may interact with your  medicine. What should I watch for while using this medicine? Tell your doctor or health care professional if your symptoms do not improve or if they get worse. Avoid alcoholic drinks while you are taking this medicine and for three days afterward. Alcohol may make you feel dizzy, sick, or flushed. If you are being treated for a sexually transmitted disease, avoid sexual contact until you have finished your treatment. Your sexual partner may also need treatment. What side effects may I notice from receiving this medicine? Side effects that you should report to your doctor or health care professional as soon as possible: -allergic reactions like skin rash, itching or hives, swelling of the face, lips, or tongue -breathing problems -confusion, depression -dark or white patches in the mouth -feeling faint or lightheaded, falls -fever, infection -numbness, tingling, pain or weakness in the hands or feet -pain when passing urine -seizures -unusually weak or tired -vaginal irritation or discharge -vomiting Side effects that usually do not require medical attention (report to your doctor or health care professional if they continue or are bothersome): -dark brown or reddish urine -diarrhea -headache -loss of appetite -metallic taste -nausea -stomach upset This list may not describe all possible side effects. Call your doctor for medical advice about side effects. You may report side effects to FDA at 1-800-FDA-1088. Where should I keep my medicine? Keep out of the  reach of children. Store at room temperature between 15 and 30 degrees C (59 and 86 degrees F). Protect from light and moisture. Keep container tightly closed. Throw away any unused medicine after the expiration date. NOTE: This sheet is a summary. It may not cover all possible information. If you have questions about this medicine, talk to your doctor, pharmacist, or health care provider.  2015, Elsevier/Gold Standard. (2008-07-23  15:22:28) Bacterial Vaginosis Bacterial vaginosis is a vaginal infection that occurs when the normal balance of bacteria in the vagina is disrupted. It results from an overgrowth of certain bacteria. This is the most common vaginal infection in women of childbearing age. Treatment is important to prevent complications, especially in pregnant women, as it can cause a premature delivery. CAUSES  Bacterial vaginosis is caused by an increase in harmful bacteria that are normally present in smaller amounts in the vagina. Several different kinds of bacteria can cause bacterial vaginosis. However, the reason that the condition develops is not fully understood. RISK FACTORS Certain activities or behaviors can put you at an increased risk of developing bacterial vaginosis, including: Having a new sex partner or multiple sex partners. Douching. Using an intrauterine device (IUD) for contraception. Women do not get bacterial vaginosis from toilet seats, bedding, swimming pools, or contact with objects around them. SIGNS AND SYMPTOMS  Some women with bacterial vaginosis have no signs or symptoms. Common symptoms include: Grey vaginal discharge. A fishlike odor with discharge, especially after sexual intercourse. Itching or burning of the vagina and vulva. Burning or pain with urination. DIAGNOSIS  Your health care provider will take a medical history and examine the vagina for signs of bacterial vaginosis. A sample of vaginal fluid may be taken. Your health care provider will look at this sample under a microscope to check for bacteria and abnormal cells. A vaginal pH test may also be done.  TREATMENT  Bacterial vaginosis may be treated with antibiotic medicines. These may be given in the form of a pill or a vaginal cream. A second round of antibiotics may be prescribed if the condition comes back after treatment.  HOME CARE INSTRUCTIONS  Only take over-the-counter or prescription medicines as directed by  your health care provider. If antibiotic medicine was prescribed, take it as directed. Make sure you finish it even if you start to feel better. Do not have sex until treatment is completed. Tell all sexual partners that you have a vaginal infection. They should see their health care provider and be treated if they have problems, such as a mild rash or itching. Practice safe sex by using condoms and only having one sex partner. SEEK MEDICAL CARE IF:  Your symptoms are not improving after 3 days of treatment. You have increased discharge or pain. You have a fever. MAKE SURE YOU:  Understand these instructions. Will watch your condition. Will get help right away if you are not doing well or get worse. FOR MORE INFORMATION  Centers for Disease Control and Prevention, Division of STD Prevention: [employer]s.co.za American Sexual Health Association (ASHA): www.ashastd.org  Document Released: 10/26/2005 Document Revised: 08/16/2013 Document Reviewed: 06/07/2013 [facility] Patient Information 2015 [place], [place]. This information is not intended to replace advice given to you by your health care provider. Make sure you discuss any questions you have with your health care provider.

## 2014-10-01 NOTE — Progress Notes (Signed)
Cassandra CossLeslie A Russell December 22, 1990 [id number]   History:    23 y.o.  presented to the office today for her annual gynecological exam. Patient states that she has only had 2 menstrual cycles this year. She denied any nipple discharge or any double vision but occasional headaches were reported. She has complained of weight gain since last year. She is not using any form of contraception. She's currently on no medications. She feels bloated at times and occasional dyspareunia. She is having some vaginal discharge with odor for the past month as well. Patient has had history of chlamydia in the past as well as low-grade dysplasia.  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  Pap smear was normal 2013 in 2014 were normal. Patient did complete Gardasil vaccine series in 2013.  Patient requesting flu vaccine today.  Past medical history,surgical history, family history and social history were all reviewed and documented in the EPIC chart.  Gynecologic History No LMP recorded. Patient is not currently having periods (Reason: Other). Contraception: none Last Pap: 2014. Results were: normal Last mammogram: Not indicated. Results were: Not indicated  Obstetric History OB History  Gravida Para Term Preterm AB SAB TAB Ectopic Multiple Living  1 1        1     # Outcome Date GA Lbr Len/2nd Weight Sex Delivery Anes PTL Lv  1 Para     M Vag-Spont          ROS: A ROS was performed and pertinent positives and negatives are included in the history.  GENERAL: No fevers or chills. HEENT: No change in vision, no earache, sore throat or sinus congestion. NECK: No pain or stiffness. CARDIOVASCULAR: No chest pain or pressure. No palpitations. PULMONARY: No  shortness of breath, cough or wheeze. GASTROINTESTINAL: No abdominal pain, nausea, vomiting or diarrhea, melena or bright red blood per rectum. GENITOURINARY: No urinary frequency, urgency, hesitancy or dysuria. MUSCULOSKELETAL: No joint or muscle pain, no back pain, no recent trauma. DERMATOLOGIC: No rash, no itching, no lesions. ENDOCRINE: No polyuria, polydipsia, no heat or cold intolerance. No recent change in weight. HEMATOLOGICAL: No anemia or easy bruising or bleeding. NEUROLOGIC: No headache, seizures, numbness, tingling or weakness. PSYCHIATRIC: No depression, no loss of interest in normal activity or change in sleep pattern.     Exam: chaperone present  BP 120/70 mmHg  Ht 5\' 1"  (1.549 m)  Wt 147 lb (66.679 kg)  BMI 27.79 kg/m2  Body mass index is 27.79 kg/(m^2).  General appearance : Well developed well nourished female. No acute distress HEENT: Neck supple, trachea midline, no carotid bruits, no thyroidmegaly Lungs: Clear to auscultation, no rhonchi or wheezes, or rib retractions  Heart: Regular rate and rhythm, no murmurs or gallops Breast:Examined in sitting and supine position were symmetrical in appearance, no palpable masses or tenderness,  no skin retraction, no nipple inversion, no nipple discharge, no skin discoloration, no axillary or supraclavicular lymphadenopathy Abdomen: no palpable masses or tenderness, no rebound or guarding Extremities: no edema or skin discoloration or tenderness  Pelvic:  Bartholin, Urethra, Skene Glands: Within normal limits             Vagina: Frothy  fishy odor discharge  Cervix: No gross lesions or discharge  Uterus  anteverted, normal size, shape  and consistency, non-tender and mobile  Adnexa  Without masses or tenderness  Anus and perineum  normal   Rectovaginal  normal sphincter tone without palpated masses or tenderness             Hemoccult not indicated   Wet prep:Positive Amine, moderate clue cell, too numerous to count  bacteria  GC and Chlamydia culture pending at time of this dictation  Urine pregnancy test today negative  Assessment/Plan:  23 y.o. female for annual exam with secondary amenorrhea and mild headaches. We are going to check a TSH, and prolactin along with her CBC, screening cholesterol, compresses a metabolic panel and urinalysis. If her tests are normal as prescribed her Provera 10 mg to take 1 pill daily for 10 days to withdrawal. She would like to go on oral contraceptive pill we will start her on Alesse 28 day oral contraceptive pill. Patient denies any family history or personal history of clotting or bleeding disorder. The risks benefits and pros and cons were discussed to include DVT and pulmonary embolism. Patient is a nonsmoker and instructions were provided. We will do a Pap smear this year and if negative will be 3 consecutive years and then we'll proceed then with following the guidelines of every 3 years. She was reminded do her monthly self breast examination. Patient received the flu vaccine today. For patient's bacterial vaginosis she will be prescribed Tindamax 500 mg to take 4 tablets today repeat in 24 hours. She will return back in 2 weeks for pelvic ultrasound to better assess her adnexa due to her abdominal discomfort and secondary amenorrhea.   Ok EdwardsFERNANDEZ,JUAN H MD, 3:37 PM 10/01/2014

## 2014-10-02 LAB — URINALYSIS W MICROSCOPIC + REFLEX CULTURE
Bilirubin Urine: NEGATIVE
CRYSTALS: NONE SEEN
Casts: NONE SEEN
Glucose, UA: NEGATIVE mg/dL
Hgb urine dipstick: NEGATIVE
Ketones, ur: NEGATIVE mg/dL
Leukocytes, UA: NEGATIVE
NITRITE: NEGATIVE
PROTEIN: NEGATIVE mg/dL
SPECIFIC GRAVITY, URINE: 1.028 (ref 1.005–1.030)
Urobilinogen, UA: 0.2 mg/dL (ref 0.0–1.0)
pH: 5.5 (ref 5.0–8.0)

## 2014-10-02 LAB — COMPREHENSIVE METABOLIC PANEL
ALT: 10 U/L (ref 0–35)
AST: 15 U/L (ref 0–37)
Albumin: 4.4 g/dL (ref 3.5–5.2)
Alkaline Phosphatase: 57 U/L (ref 39–117)
BILIRUBIN TOTAL: 0.3 mg/dL (ref 0.2–1.2)
BUN: 16 mg/dL (ref 6–23)
CO2: 28 mEq/L (ref 19–32)
CREATININE: 0.69 mg/dL (ref 0.50–1.10)
Calcium: 9.4 mg/dL (ref 8.4–10.5)
Chloride: 103 mEq/L (ref 96–112)
Glucose, Bld: 80 mg/dL (ref 70–99)
Potassium: 3.9 mEq/L (ref 3.5–5.3)
SODIUM: 141 meq/L (ref 135–145)
TOTAL PROTEIN: 6.9 g/dL (ref 6.0–8.3)

## 2014-10-02 LAB — CBC WITH DIFFERENTIAL/PLATELET
BASOS ABS: 0 10*3/uL (ref 0.0–0.1)
Basophils Relative: 0 % (ref 0–1)
Eosinophils Absolute: 0.2 10*3/uL (ref 0.0–0.7)
Eosinophils Relative: 2 % (ref 0–5)
HCT: 39.1 % (ref 36.0–46.0)
Hemoglobin: 13.1 g/dL (ref 12.0–15.0)
LYMPHS PCT: 33 % (ref 12–46)
Lymphs Abs: 2.6 10*3/uL (ref 0.7–4.0)
MCH: 29.4 pg (ref 26.0–34.0)
MCHC: 33.5 g/dL (ref 30.0–36.0)
MCV: 87.9 fL (ref 78.0–100.0)
MPV: 10.1 fL (ref 9.4–12.4)
Monocytes Absolute: 0.4 10*3/uL (ref 0.1–1.0)
Monocytes Relative: 5 % (ref 3–12)
NEUTROS PCT: 60 % (ref 43–77)
Neutro Abs: 4.7 10*3/uL (ref 1.7–7.7)
PLATELETS: 345 10*3/uL (ref 150–400)
RBC: 4.45 MIL/uL (ref 3.87–5.11)
RDW: 12.4 % (ref 11.5–15.5)
WBC: 7.9 10*3/uL (ref 4.0–10.5)

## 2014-10-02 LAB — CHOLESTEROL, TOTAL: CHOLESTEROL: 166 mg/dL (ref 0–200)

## 2014-10-02 LAB — PROLACTIN: Prolactin: 7.6 ng/mL

## 2014-10-02 LAB — GC/CHLAMYDIA PROBE AMP
CT PROBE, AMP APTIMA: NEGATIVE
GC Probe RNA: NEGATIVE

## 2014-10-02 LAB — TSH: TSH: 0.66 u[IU]/mL (ref 0.350–4.500)

## 2014-10-03 ENCOUNTER — Telehealth: Payer: Self-pay

## 2014-10-03 ENCOUNTER — Other Ambulatory Visit: Payer: Self-pay | Admitting: Gynecology

## 2014-10-03 LAB — URINE CULTURE

## 2014-10-03 LAB — CYTOLOGY - PAP

## 2014-10-03 MED ORDER — METRONIDAZOLE 500 MG PO TABS: 500.0000 mg | ORAL_TABLET | Freq: Two times a day (BID) | ORAL | Status: DC

## 2014-10-03 NOTE — Telephone Encounter (Signed)
rx sent to pharmacy

## 2014-10-03 NOTE — Telephone Encounter (Signed)
Tindamax is generic now but ahead and call in Flagyl 500 mg one by mouth twice a day for 7 days.

## 2014-10-03 NOTE — Telephone Encounter (Signed)
Received a note from pharmacy that Tindamax recently prescribed is not covered by Medicaid.  They said Metronidazole is covered and ask that we send an alternate Rx if appropriate.

## 2014-10-08 ENCOUNTER — Other Ambulatory Visit: Payer: Self-pay | Admitting: Gynecology

## 2014-10-08 ENCOUNTER — Telehealth: Payer: Self-pay

## 2014-10-08 DIAGNOSIS — R8271 Bacteriuria: Secondary | ICD-10-CM

## 2014-10-08 MED ORDER — FLUCONAZOLE 150 MG PO TABS: 150.0000 mg | ORAL_TABLET | Freq: Once | ORAL | Status: DC

## 2014-10-08 NOTE — Telephone Encounter (Signed)
Rx sent. Patient informed. 

## 2014-10-08 NOTE — Telephone Encounter (Signed)
Patient is taking Metronidazole x 7 days.  Patient said she has history of getting vag yeast infection after taking antibiotic and she wanted to see if you would prescribe Rx for yeast infection now so she can have it on hand.

## 2014-10-08 NOTE — Telephone Encounter (Signed)
Call in prescription for Diflucan 150 mg one by mouth with 1 refill

## 2014-10-17 ENCOUNTER — Telehealth: Payer: Self-pay | Admitting: *Deleted

## 2014-10-17 NOTE — Telephone Encounter (Signed)
Pt was given Provera to jump start period, pt said no cycle yet. Pt took last pill on 10/14/14 I explained to pt best to wait 14 days after last pill if no period to call so Dr.Fernandez can be informed. Pt will follow up

## 2014-10-17 NOTE — Telephone Encounter (Signed)
Pt called and left message in voicmail with questions about Rx Dr.Fernandez gave her? I left message for pt to call

## 2014-10-31 ENCOUNTER — Other Ambulatory Visit: Payer: Self-pay | Admitting: Gynecology

## 2014-10-31 ENCOUNTER — Ambulatory Visit (INDEPENDENT_AMBULATORY_CARE_PROVIDER_SITE_OTHER): Payer: Medicaid Other | Admitting: Gynecology

## 2014-10-31 ENCOUNTER — Encounter: Payer: Self-pay | Admitting: Gynecology

## 2014-10-31 ENCOUNTER — Ambulatory Visit (INDEPENDENT_AMBULATORY_CARE_PROVIDER_SITE_OTHER): Payer: Medicaid Other

## 2014-10-31 DIAGNOSIS — N970 Female infertility associated with anovulation: Secondary | ICD-10-CM

## 2014-10-31 DIAGNOSIS — E282 Polycystic ovarian syndrome: Secondary | ICD-10-CM

## 2014-10-31 DIAGNOSIS — N911 Secondary amenorrhea: Secondary | ICD-10-CM

## 2014-10-31 DIAGNOSIS — N949 Unspecified condition associated with female genital organs and menstrual cycle: Secondary | ICD-10-CM

## 2014-10-31 DIAGNOSIS — R102 Pelvic and perineal pain: Secondary | ICD-10-CM

## 2014-10-31 DIAGNOSIS — R8271 Bacteriuria: Secondary | ICD-10-CM

## 2014-10-31 DIAGNOSIS — N912 Amenorrhea, unspecified: Secondary | ICD-10-CM

## 2014-10-31 DIAGNOSIS — N390 Urinary tract infection, site not specified: Secondary | ICD-10-CM

## 2014-10-31 HISTORY — DX: Polycystic ovarian syndrome: E28.2

## 2014-10-31 NOTE — Progress Notes (Signed)
   Patient presented to the office today to discuss her ultrasound. Patient is a 23 year old who was seen the office on November 23 of this year for her annual gynecological examination. Patient had reported having only had 2 menstrual cycles in the past year.She denied any nipple discharge or any double vision but occasional headaches were reported. She has complained of weight gain since last year. She is not using any form of contraception. She's currently on no medications. She feels bloated at times and occasional dyspareunia. Last office visit she was treated for bacterial vaginosis with Tindamax and is asymptomatic today. Patient had a negative GC and Chlamydia culture. Many years ago patient was treated for Chlamydia.  Patient's recent lab work consisted of a normal CBC, comprehensive metabolic panel, TSH, and prolactin. Her urine had few bacteria but culture demonstrated next bacterial flora with no specific isolated microorganism. Patient's Pap smear was also negative.  Patient had been given Provera 10 mg to take 1 PO daily to jump start her cycle and then she started Alesse 28 day oral contraceptive pill. Ultrasound: Uterus measures 6.3 x 5.4 x 2.8 cm. Right and left ovary were normal. Multiple tiny follicles noted on both ovaries with increase ovarian volume the left with 10 cc and right ovary was 16 cc consistent with PCOS.  Assessment/plan: Patient with clinical evidence of PCO S (hirsutism, chronic ovulation, amenorrhea, oily skin) patient recently started on Aleese 28 day oral contraceptive pill. She will continue on this regimen until she desires to conceive. We discussed that in the future she may be a candidate for ovulation induction medication if she does not begin 12 relate spontaneously after discontinuation of the oral contraceptive pill. We'll see her back in one year or when necessary. Urinalysis repeated today as a result of her recent bacteriuria and negative culture.

## 2014-10-31 NOTE — Patient Instructions (Signed)
Polycystic Ovarian Syndrome  Polycystic ovarian syndrome (PCOS) is a common hormonal disorder among women of reproductive age. Most women with PCOS grow many small cysts on their ovaries. PCOS can cause problems with your periods and make it difficult to get pregnant. It can also cause an increased risk of miscarriage with pregnancy. If left untreated, PCOS can lead to serious health problems, such as diabetes and heart disease.  CAUSES  The cause of PCOS is not fully understood, but genetics may be a factor.  SIGNS AND SYMPTOMS    Infrequent or no menstrual periods.    Inability to get pregnant (infertility) because of not ovulating.    Increased growth of hair on the face, chest, stomach, back, thumbs, thighs, or toes.    Acne, oily skin, or dandruff.    Pelvic pain.    Weight gain or obesity, usually carrying extra weight around the waist.    Type 2 diabetes.    High cholesterol.    High blood pressure.    Female-pattern baldness or thinning hair.    Patches of thickened and dark brown or black skin on the neck, arms, breasts, or thighs.    Tiny excess flaps of skin (skin tags) in the armpits or neck area.    Excessive snoring and having breathing stop at times while asleep (sleep apnea).    Deepening of the voice.    Gestational diabetes when pregnant.   DIAGNOSIS   There is no single test to diagnose PCOS.    Your health care provider will:    Take a medical history.    Perform a pelvic exam.    Have ultrasonography done.    Check your female and female hormone levels.    Measure glucose or sugar levels in the blood.    Do other blood tests.    If you are producing too many female hormones, your health care provider will make sure it is from PCOS. At the physical exam, your health care provider will want to evaluate the areas of increased hair growth. Try to allow natural hair growth for a few days before the visit.    During a pelvic exam, the ovaries may be  enlarged or swollen because of the increased number of small cysts. This can be seen more easily by using vaginal ultrasonography or screening to examine the ovaries and lining of the uterus (endometrium) for cysts. The uterine lining may become thicker if you have not been having a regular period.   TREATMENT   Because there is no cure for PCOS, it needs to be managed to prevent problems. Treatments are based on your symptoms. Treatment is also based on whether you want to have a baby or whether you need contraception.   Treatment may include:    Progesterone hormone to start a menstrual period.    Birth control pills to make you have regular menstrual periods.    Medicines to make you ovulate, if you want to get pregnant.    Medicines to control your insulin.    Medicine to control your blood pressure.    Medicine and diet to control your high cholesterol and triglycerides in your blood.   Medicine to reduce excessive hair growth.   Surgery, making small holes in the ovary, to decrease the amount of female hormone production. This is done through a long, lighted tube (laparoscope) placed into the pelvis through a tiny incision in the lower abdomen.   HOME CARE INSTRUCTIONS   Only   take over-the-counter or prescription medicine as directed by your health care provider.   Pay attention to the foods you eat and your activity levels. This can help reduce the effects of PCOS.   Keep your weight under control.   Eat foods that are low in carbohydrate and high in fiber.   Exercise regularly.  SEEK MEDICAL CARE IF:   Your symptoms do not get better with medicine.   You have new symptoms.  Document Released: 02/19/2005 Document Revised: 08/16/2013 Document Reviewed: 04/13/2013  [employer] Patient Information 2015 [employer]. This information is not intended to replace advice given to you by your health care provider. Make sure you discuss any questions you have with your health care provider.

## 2014-11-01 LAB — URINALYSIS W MICROSCOPIC + REFLEX CULTURE
BILIRUBIN URINE: NEGATIVE
Casts: NONE SEEN
GLUCOSE, UA: NEGATIVE mg/dL
Ketones, ur: NEGATIVE mg/dL
LEUKOCYTES UA: NEGATIVE
Nitrite: NEGATIVE
PROTEIN: NEGATIVE mg/dL
Specific Gravity, Urine: >1.03 — ABNORMAL HIGH (ref 1.005–1.030)
UROBILINOGEN UA: 1 mg/dL (ref 0.0–1.0)
pH: 6 (ref 5.0–8.0)

## 2014-11-06 ENCOUNTER — Other Ambulatory Visit: Payer: Self-pay | Admitting: Gynecology

## 2014-11-06 ENCOUNTER — Telehealth: Payer: Self-pay

## 2014-11-06 DIAGNOSIS — R3129 Other microscopic hematuria: Secondary | ICD-10-CM

## 2014-11-06 NOTE — Telephone Encounter (Signed)
Yes, thank you.

## 2014-11-06 NOTE — Telephone Encounter (Signed)
-----   Message from Ok EdwardsJuan H Fernandez, MD sent at 11/05/2014 10:15 PM EST ----- Will need to repeat her urinalysis in 1 week. Some blood was noted in her urine at time of collection

## 2014-11-06 NOTE — Telephone Encounter (Signed)
Patient said the day she was here she was having some spotting even though she had her period earlier in Dec. It was her first month on the bcps. I reassured her that BTB can be a normal side affect the first few months on the pill to keep taking a pill every day as directed.  I advised htat if spotting continues outside of that first three month starting window that office visit recommended.  Was my advice ok with you?

## 2015-09-04 ENCOUNTER — Telehealth: Payer: Self-pay | Admitting: *Deleted

## 2015-09-04 MED ORDER — FLUCONAZOLE 150 MG PO TABS: ORAL_TABLET | ORAL | Status: DC

## 2015-09-04 NOTE — Telephone Encounter (Signed)
Pt called c/o yeast infection itching and white discharge, irritation, declined appointment. Requesting 2 diflucan tablets. Please advise

## 2015-09-04 NOTE — Telephone Encounter (Signed)
Call in Diflucan 150 mg one by mouth May repeat in 2-3 days and no improvement her symptoms. Make sure she's using contraception. If symptoms continue she'll need to return to the office.

## 2015-09-04 NOTE — Telephone Encounter (Signed)
Pt aware of the below Rx sent

## 2015-09-24 ENCOUNTER — Encounter: Payer: Self-pay | Admitting: Gynecology

## 2015-09-24 ENCOUNTER — Ambulatory Visit (INDEPENDENT_AMBULATORY_CARE_PROVIDER_SITE_OTHER): Payer: Self-pay | Admitting: Gynecology

## 2015-09-24 VITALS — BP 150/88 | Ht 61.0 in | Wt 136.0 lb

## 2015-09-24 DIAGNOSIS — A499 Bacterial infection, unspecified: Secondary | ICD-10-CM

## 2015-09-24 DIAGNOSIS — N912 Amenorrhea, unspecified: Secondary | ICD-10-CM

## 2015-09-24 DIAGNOSIS — N760 Acute vaginitis: Secondary | ICD-10-CM

## 2015-09-24 DIAGNOSIS — E282 Polycystic ovarian syndrome: Secondary | ICD-10-CM

## 2015-09-24 DIAGNOSIS — Z8679 Personal history of other diseases of the circulatory system: Secondary | ICD-10-CM

## 2015-09-24 DIAGNOSIS — G44001 Cluster headache syndrome, unspecified, intractable: Secondary | ICD-10-CM

## 2015-09-24 DIAGNOSIS — B9689 Other specified bacterial agents as the cause of diseases classified elsewhere: Secondary | ICD-10-CM

## 2015-09-24 DIAGNOSIS — Z01419 Encounter for gynecological examination (general) (routine) without abnormal findings: Secondary | ICD-10-CM

## 2015-09-24 DIAGNOSIS — N898 Other specified noninflammatory disorders of vagina: Secondary | ICD-10-CM

## 2015-09-24 LAB — CBC WITH DIFFERENTIAL/PLATELET
Basophils Absolute: 0 10*3/uL (ref 0.0–0.1)
Basophils Relative: 0 % (ref 0–1)
Eosinophils Absolute: 0.1 10*3/uL (ref 0.0–0.7)
Eosinophils Relative: 1 % (ref 0–5)
HCT: 41.2 % (ref 36.0–46.0)
Hemoglobin: 13.6 g/dL (ref 12.0–15.0)
LYMPHS ABS: 2.2 10*3/uL (ref 0.7–4.0)
LYMPHS PCT: 24 % (ref 12–46)
MCH: 29.4 pg (ref 26.0–34.0)
MCHC: 33 g/dL (ref 30.0–36.0)
MCV: 89 fL (ref 78.0–100.0)
MONOS PCT: 4 % (ref 3–12)
MPV: 10.2 fL (ref 8.6–12.4)
Monocytes Absolute: 0.4 10*3/uL (ref 0.1–1.0)
NEUTROS ABS: 6.5 10*3/uL (ref 1.7–7.7)
NEUTROS PCT: 71 % (ref 43–77)
PLATELETS: 338 10*3/uL (ref 150–400)
RBC: 4.63 MIL/uL (ref 3.87–5.11)
RDW: 12.4 % (ref 11.5–15.5)
WBC: 9.1 10*3/uL (ref 4.0–10.5)

## 2015-09-24 LAB — WET PREP FOR TRICH, YEAST, CLUE
Trich, Wet Prep: NONE SEEN
Yeast Wet Prep HPF POC: NONE SEEN

## 2015-09-24 LAB — PREGNANCY, URINE: Preg Test, Ur: NEGATIVE

## 2015-09-24 LAB — CHOLESTEROL, TOTAL: CHOLESTEROL: 150 mg/dL (ref 125–200)

## 2015-09-24 LAB — TSH: TSH: 0.405 u[IU]/mL (ref 0.350–4.500)

## 2015-09-24 MED ORDER — LEVONORGESTREL-ETHINYL ESTRAD 0.1-20 MG-MCG PO TABS: 1.0000 | ORAL_TABLET | Freq: Every day | ORAL | Status: DC

## 2015-09-24 MED ORDER — MEDROXYPROGESTERONE ACETATE 10 MG PO TABS: ORAL_TABLET | ORAL | Status: DC

## 2015-09-24 MED ORDER — METRONIDAZOLE 500 MG PO TABS: 500.0000 mg | ORAL_TABLET | Freq: Two times a day (BID) | ORAL | Status: DC

## 2015-09-24 NOTE — Progress Notes (Signed)
Cassandra Russell 1991/05/17 [id number]   History:    24 y.o.  for annual gyn exam with several complaints today. Patient reports not having had a menstrual cycle since March. 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 She had been given Provera in the past and started on oral contraceptive pill. Due to no insurance she did not continue on the oral contraceptive pill. She denies any visual disturbances or any nipple discharge. She has complaining of some headaches. Review of her record indicated that last year she had seen Dr. Dietrich PatesPaula Ross cardiologist and had been diagnosed with SVT. Her blood pressure readings today were 148/88 and 150/88. She was also complaining of a slight vaginal discharge. She is in a steady monogamous relationship.  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  Pap smear was normal 2013 in 2014 and 2015.Patient did complete Gardasil vaccine series in 2013.  Past medical history,surgical history, family history and social history were all reviewed and documented in the EPIC chart.  Gynecologic History Patient's last menstrual period was 01/22/2015. Contraception: none Last Pap: 2015. Results were: normal Last mammogram: Not indicated. Results were: Not indicated  Obstetric History OB History  Gravida Para Term Preterm AB SAB TAB Ectopic Multiple Living  1 1        1     # Outcome Date GA Lbr Len/2nd Weight Sex Delivery Anes PTL Lv  1 Para     M Vag-Spont          ROS: A ROS was performed and pertinent positives and negatives are included in the history.  GENERAL: No fevers or chills. HEENT: No change in vision, no earache,  sore throat or sinus congestion. NECK: No pain or stiffness. CARDIOVASCULAR: No chest pain or pressure. No palpitations. PULMONARY: No shortness of breath, cough or wheeze. GASTROINTESTINAL: No abdominal pain, nausea, vomiting or diarrhea, melena or bright red blood per rectum. GENITOURINARY: No urinary frequency, urgency, hesitancy or dysuria. MUSCULOSKELETAL: No joint or muscle pain, no back pain, no recent trauma. DERMATOLOGIC: No rash, no itching, no lesions. ENDOCRINE: No polyuria, polydipsia, no heat or cold intolerance. No recent change in weight. HEMATOLOGICAL: No anemia or easy bruising or bleeding. NEUROLOGIC: No headache, seizures, numbness, tingling or weakness. PSYCHIATRIC: No depression, no loss of interest in normal activity or change in sleep pattern.     Exam: chaperone present  BP 150/88 mmHg  Ht 5\' 1"  (1.549 m)  Wt 136 lb (61.689 kg)  BMI 25.71 kg/m2  LMP 01/22/2015  Body mass index is 25.71 kg/(m^2).  General appearance : Well developed well nourished female. No acute distress HEENT: Eyes: no retinal hemorrhage or exudates,  Neck supple, trachea midline, no carotid bruits, no thyroidmegaly Lungs: Clear to auscultation, no rhonchi or wheezes, or rib retractions  Heart: Regular rate and rhythm, no murmurs or gallops Breast:Examined in sitting and supine position were symmetrical in appearance, no palpable masses or tenderness,  no skin retraction, no nipple inversion, no nipple discharge, no skin discoloration, no axillary or supraclavicular lymphadenopathy Abdomen: no palpable masses or tenderness, no rebound or guarding Extremities: no edema or skin discoloration or tenderness  Pelvic:  Bartholin, Urethra, Skene Glands: Within normal limits  Vagina: No gross lesions or discharge, frothy white discharge  Cervix: No gross lesions or discharge  Uterus  anteverted, normal size, shape and consistency, non-tender and mobile  Adnexa  Without masses or  tenderness  Anus and perineum  normal   Rectovaginal  normal sphincter tone without palpated masses or tenderness             Hemoccult not indicated   Wet prep few clue cells rare WBC too numerous to count bacteria  Negative urine pregnancy test  Assessment/Plan:  24 y.o. female for annual exam will be instructed to follow-up next week with her cardiologist. She will hold off on starting the oral contraceptive pill for at least a month until she gets evaluated by her cardiologist again. She will be prescribed Provera 10 mg to take 1 by mouth daily to start her cycle. If she does not have a menstrual cycle within 30 days she will take the Provera once again for 10 days if her home presently test is negative. If her cardiologist clears her to take the oral contraceptive pill and if her blood pressures under control then she can start her oral contraceptive pill after her upcoming cycle. The following labs were ordered today: TSH, prolactin, CBC, screening cholesterol and urinalysis. Pap smear was not done today. Patient declined flu vaccine today. Her bacterial vaginosis she was prescribed Flagyl 500 mg one by mouth twice a day for 7 days.   [name] MD, 5:01 PM 09/24/2015

## 2015-09-24 NOTE — Patient Instructions (Signed)

## 2015-09-25 LAB — URINALYSIS W MICROSCOPIC + REFLEX CULTURE
Bilirubin Urine: NEGATIVE
CASTS: NONE SEEN [LPF]
Crystals: NONE SEEN [HPF]
GLUCOSE, UA: NEGATIVE
Hgb urine dipstick: NEGATIVE
Ketones, ur: NEGATIVE
LEUKOCYTES UA: NEGATIVE
NITRITE: NEGATIVE
PH: 6.5 (ref 5.0–8.0)
Protein, ur: NEGATIVE
RBC / HPF: NONE SEEN RBC/HPF (ref <=2)
Specific Gravity, Urine: 1.023 (ref 1.001–1.035)
YEAST: NONE SEEN [HPF]

## 2015-09-25 LAB — PROLACTIN: Prolactin: 11 ng/mL

## 2015-09-26 LAB — URINE CULTURE: Colony Count: 30000

## 2015-09-27 ENCOUNTER — Other Ambulatory Visit: Payer: Self-pay | Admitting: Gynecology

## 2015-09-27 MED ORDER — PENICILLIN V POTASSIUM 250 MG PO TABS: 250.0000 mg | ORAL_TABLET | Freq: Two times a day (BID) | ORAL | Status: DC

## 2015-12-12 ENCOUNTER — Telehealth: Payer: Self-pay

## 2015-12-12 MED ORDER — PENICILLIN V POTASSIUM 250 MG PO TABS: 250.0000 mg | ORAL_TABLET | Freq: Two times a day (BID) | ORAL | Status: DC

## 2015-12-12 MED ORDER — FLUCONAZOLE 150 MG PO TABS: 150.0000 mg | ORAL_TABLET | Freq: Once | ORAL | Status: DC

## 2015-12-12 NOTE — Telephone Encounter (Signed)
Patient was in 09/24/15 and diagnosed with UTI and bacterial vaginal infection. She said that while she was taking the med she missed a day/dose and later tried to make it up.   She has symptoms of both infections again. Urgency and burning with urination and also vaginal discharge with odor like before.  I told her since that was November we would recommend office visit. She said she cannot afford to come in and asked if you could prescribe for her again.

## 2015-12-12 NOTE — Telephone Encounter (Signed)
Pen VEE K 250 MG BID for 7 days. Diflucan 1`50 mg PO. If symptoms persist will need OV. This antibiotic is based on result of last culture/sensitivity/microorganism. Make sure patient not pregnant.

## 2015-12-12 NOTE — Telephone Encounter (Signed)
Patient advised to check to make sure she is not pregnant prior to taking medications. Rx's sent.  Advised office visit if symptoms persist.

## 2015-12-12 NOTE — Telephone Encounter (Signed)
Already had encounter opened. 

## 2015-12-26 ENCOUNTER — Telehealth: Payer: Self-pay | Admitting: *Deleted

## 2015-12-26 MED ORDER — PHENAZOPYRIDINE HCL 200 MG PO TABS: 200.0000 mg | ORAL_TABLET | Freq: Three times a day (TID) | ORAL | Status: DC | PRN

## 2015-12-26 MED ORDER — FLUCONAZOLE 150 MG PO TABS: 150.0000 mg | ORAL_TABLET | Freq: Once | ORAL | Status: DC

## 2015-12-26 MED ORDER — ERYTHROMYCIN BASE 500 MG PO TABS
500.0000 mg | ORAL_TABLET | Freq: Two times a day (BID) | ORAL | Status: DC
Start: 2015-12-26 — End: 2016-11-10

## 2015-12-26 NOTE — Telephone Encounter (Signed)
Pt called c/o burning with urination, frequency, urgency, pressure with urination. Was treated with pencillin on 12/12/15 for UTI. Sx's got better now have returned. Doesn't have insurance therefore requested prescription.  Please advise KW CMA

## 2015-12-26 NOTE — Telephone Encounter (Signed)
ERY-TAB 500 mg tablets. Take 1 tablet twice a day for 7 days. For the burning with urination he can call in Pyridium 200 mg 3 times a day for 3 days. If she develops a yeast infection call in Diflucan 150 mg one by mouth and after one week of treatment symptoms do not improve she will need an office visit

## 2015-12-26 NOTE — Telephone Encounter (Signed)
Dr.fernandez please clarify "ERY-TAB 500 mg tablets"

## 2015-12-26 NOTE — Telephone Encounter (Signed)
Pt aware of all the below, Rx sent. 

## 2015-12-26 NOTE — Telephone Encounter (Signed)
That mis correct (Erythromycin)

## 2015-12-27 ENCOUNTER — Other Ambulatory Visit: Payer: Self-pay | Admitting: Gynecology

## 2015-12-27 ENCOUNTER — Telehealth: Payer: Self-pay

## 2015-12-27 MED ORDER — PENICILLIN V POTASSIUM 250 MG PO TABS: 250.0000 mg | ORAL_TABLET | Freq: Two times a day (BID) | ORAL | Status: DC

## 2015-12-27 NOTE — Telephone Encounter (Signed)
Patient informed. Rx sent 

## 2015-12-27 NOTE — Telephone Encounter (Signed)
PEN VK Tablets 500 mg one by mouth twice a day for 7 days

## 2015-12-27 NOTE — Telephone Encounter (Signed)
You prescribed 3 Rx's for her yesterday. She picked up the Pyridium and the Diflucan but she said the Erythromycin was $250.00 and she could not afford. She asked if another antibiotic you could prescribe that will hopefully be less expensive.

## 2016-01-27 ENCOUNTER — Telehealth: Payer: Self-pay

## 2016-01-27 NOTE — Telephone Encounter (Signed)
I spoke with patient and relayed this.  She is private pay and not sure she can afford to come. She asked if anyway she could just leave a urine specimen but I told her you recommended office visit. She is going to talk with her mom tonight to see if she can help her with money. She will call in the morning for appt.

## 2016-01-27 NOTE — Telephone Encounter (Signed)
Patient took Penicillin for UTI last mos.  She said symptoms never went away and she should have called sooner because now they are worse. C/o "hurts really bad with urination, going a lot but not much when I go". She c/o her urine smells bad and now she is having some back pain but not sure if related.  I told her you may recommend office visit for urinalysis.  She was concerned if you prescribed antibiotic she will not be able to afford and asked for something low cost if you do. That was reason Rx was changed by Dr. Glenetta HewJF to Penicillin. I explained to her that you have no idea of cost of Rx when you prescribe.    What to rec?

## 2016-01-27 NOTE — Telephone Encounter (Signed)
She's been treated with several different medications. She really needs to come in to be seen and run a urinalysis with culture

## 2016-01-28 ENCOUNTER — Other Ambulatory Visit: Payer: Self-pay | Admitting: Gynecology

## 2016-01-28 ENCOUNTER — Other Ambulatory Visit: Payer: Self-pay

## 2016-01-28 DIAGNOSIS — R309 Painful micturition, unspecified: Secondary | ICD-10-CM

## 2016-01-28 DIAGNOSIS — R3129 Other microscopic hematuria: Secondary | ICD-10-CM

## 2016-01-28 DIAGNOSIS — R350 Frequency of micturition: Secondary | ICD-10-CM

## 2016-01-28 NOTE — Telephone Encounter (Signed)
I informed patient that Dr. Velvet BatheF okay with her coming in and just leaving a specimen. She can do that today and I transferred her to Cecil CrankerDonna M. To schedule a lab appt.

## 2016-01-28 NOTE — Telephone Encounter (Signed)
Leaving urine is OK

## 2016-01-29 ENCOUNTER — Other Ambulatory Visit: Payer: Self-pay | Admitting: Gynecology

## 2016-01-29 LAB — URINALYSIS W MICROSCOPIC + REFLEX CULTURE
BILIRUBIN URINE: NEGATIVE
Casts: NONE SEEN [LPF]
GLUCOSE, UA: NEGATIVE
LEUKOCYTES UA: NEGATIVE
Nitrite: POSITIVE — AB
Specific Gravity, Urine: 1.032 (ref 1.001–1.035)
WBC UA: NONE SEEN WBC/HPF (ref <=5)
Yeast: NONE SEEN [HPF]
pH: 5.5 (ref 5.0–8.0)

## 2016-01-29 MED ORDER — SULFAMETHOXAZOLE-TRIMETHOPRIM 800-160 MG PO TABS: 1.0000 | ORAL_TABLET | Freq: Two times a day (BID) | ORAL | Status: DC

## 2016-01-31 LAB — URINE CULTURE: Colony Count: >=100000

## 2016-09-04 ENCOUNTER — Encounter: Payer: Self-pay | Admitting: Gynecology

## 2016-09-04 ENCOUNTER — Ambulatory Visit (INDEPENDENT_AMBULATORY_CARE_PROVIDER_SITE_OTHER): Payer: Self-pay | Admitting: Gynecology

## 2016-09-04 VITALS — BP 130/78 | Ht 61.0 in | Wt 141.0 lb

## 2016-09-04 DIAGNOSIS — N760 Acute vaginitis: Secondary | ICD-10-CM

## 2016-09-04 DIAGNOSIS — B9689 Other specified bacterial agents as the cause of diseases classified elsewhere: Secondary | ICD-10-CM

## 2016-09-04 DIAGNOSIS — N912 Amenorrhea, unspecified: Secondary | ICD-10-CM

## 2016-09-04 DIAGNOSIS — Z3490 Encounter for supervision of normal pregnancy, unspecified, unspecified trimester: Secondary | ICD-10-CM | POA: Insufficient documentation

## 2016-09-04 DIAGNOSIS — N898 Other specified noninflammatory disorders of vagina: Secondary | ICD-10-CM

## 2016-09-04 LAB — WET PREP FOR TRICH, YEAST, CLUE
TRICH WET PREP: NONE SEEN
YEAST WET PREP: NONE SEEN

## 2016-09-04 LAB — PREGNANCY, URINE: PREG TEST UR: POSITIVE — AB

## 2016-09-04 MED ORDER — METOCLOPRAMIDE HCL 10 MG PO TABS: 10.0000 mg | ORAL_TABLET | Freq: Three times a day (TID) | ORAL | 1 refills | Status: DC

## 2016-09-04 MED ORDER — CLINDAMYCIN HCL 300 MG PO CAPS: ORAL_CAPSULE | ORAL | 0 refills | Status: DC

## 2016-09-04 NOTE — Progress Notes (Signed)
   HPI: Patient is a 25 year old now gravida 2 para 1 with history of chronic anovulation/PCO S who stated she is only had 2 menstrual cycles this year in June and in July. Patient with some nausea had a home print she test which was positive. Patient with past history of Chlamydia infection several years ago. Her last pregnancy was delivered vaginally at term. Patient with some mild breast tenderness and no vaginal bleeding.  ROS: A ROS was performed and pertinent positives and negatives are included in the history.  GENERAL: No fevers or chills. HEENT: No change in vision, no earache, sore throat or sinus congestion. NECK: No pain or stiffness. CARDIOVASCULAR: No chest pain or pressure. No palpitations. PULMONARY: No shortness of breath, cough or wheeze. GASTROINTESTINAL: No abdominal pain, nausea, vomiting or diarrhea, melena or bright red blood per rectum. GENITOURINARY: No urinary frequency, urgency, hesitancy or dysuria. MUSCULOSKELETAL: No joint or muscle pain, no back pain, no recent trauma. DERMATOLOGIC: No rash, no itching, no lesions. ENDOCRINE: No polyuria, polydipsia, no heat or cold intolerance. No recent change in weight. HEMATOLOGICAL: No anemia or easy bruising or bleeding. NEUROLOGIC: No headache, seizures, numbness, tingling or weakness. PSYCHIATRIC: No depression, no loss of interest in normal activity or change in sleep pattern.   PE: Blood pressure 130/78 Gen. appearance well-developed well-nourished female Abdomen: Soft nontender no rebound or guarding Pelvic: Bartholin urethra Skene was within normal limits Vagina: Creamy discharge was noted wet prep obtained Cervix: As described above no gross lesions Uterus uterus anteverted 6 week size at most Adnexa: No palpable masses or tenderness Rectal exam not done  Urine pregnancy test positive  Wet prep: Many clue cells too numerous to count white blood cell and too numerous to count bacteria   Assessment Plan: #1 history of  chronic anovulation now pregnant by last menstrual period patient would be 9-1/2 weeks uterus measures more like 6 weeks. A quantitative beta-hCG will be drawn today and repeated in 72 are indeterminate that time to schedule the ultrasound to confirm viability and dating and also position of the pregnancy since she has had history of chlamydia in the past. #2 wet prep demonstrated evidence of bacterial vaginosis since she's pregnant she'll be placed on clindamycin 300 mg twice a day for 7 days #3 patient was instructed to begin taking prenatal vitamins 1 tablet daily. Instructions on pregnancy in the first trimester was provided.    Greater than 50% of time was spent in counseling and coordinating care of this patient.   Time of consultation: 15   Minutes.

## 2016-09-04 NOTE — Patient Instructions (Addendum)
First Trimester of Pregnancy The first trimester of pregnancy is from week 1 until the end of week 12 (months 1 through 3). A week after a sperm fertilizes an egg, the egg will implant on the wall of the uterus. This embryo will begin to develop into a baby. Genes from you and your partner are forming the baby. The female genes determine whether the baby is a boy or a girl. At 6-8 weeks, the eyes and face are formed, and the heartbeat can be seen on ultrasound. At the end of 12 weeks, all the baby's organs are formed.  Now that you are pregnant, you will want to do everything you can to have a healthy baby. Two of the most important things are to get good prenatal care and to follow your health care provider's instructions. Prenatal care is all the medical care you receive before the baby's birth. This care will help prevent, find, and treat any problems during the pregnancy and childbirth. BODY CHANGES Your body goes through many changes during pregnancy. The changes vary from woman to woman.   You may gain or lose a couple of pounds at first.  You may feel sick to your stomach (nauseous) and throw up (vomit). If the vomiting is uncontrollable, call your health care provider.  You may tire easily.  You may develop headaches that can be relieved by medicines approved by your health care provider.  You may urinate more often. Painful urination may mean you have a bladder infection.  You may develop heartburn as a result of your pregnancy.  You may develop constipation because certain hormones are causing the muscles that push waste through your intestines to slow down.  You may develop hemorrhoids or swollen, bulging veins (varicose veins).  Your breasts may begin to grow larger and become tender. Your nipples may stick out more, and the tissue that surrounds them (areola) may become darker.  Your gums may bleed and may be sensitive to brushing and flossing.  Dark spots or blotches (chloasma,  mask of pregnancy) may develop on your face. This will likely fade after the baby is born.  Your menstrual periods will stop.  You may have a loss of appetite.  You may develop cravings for certain kinds of food.  You may have changes in your emotions from day to day, such as being excited to be pregnant or being concerned that something may go wrong with the pregnancy and baby.  You may have more vivid and strange dreams.  You may have changes in your hair. These can include thickening of your hair, rapid growth, and changes in texture. Some women also have hair loss during or after pregnancy, or hair that feels dry or thin. Your hair will most likely return to normal after your baby is born. WHAT TO EXPECT AT YOUR PRENATAL VISITS During a routine prenatal visit:  You will be weighed to make sure you and the baby are growing normally.  Your blood pressure will be taken.  Your abdomen will be measured to track your baby's growth.  The fetal heartbeat will be listened to starting around week 10 or 12 of your pregnancy.  Test results from any previous visits will be discussed. Your health care provider may ask you:  How you are feeling.  If you are feeling the baby move.  If you have had any abnormal symptoms, such as leaking fluid, bleeding, severe headaches, or abdominal cramping.  If you are using any tobacco products,   including cigarettes, chewing tobacco, and electronic cigarettes.  If you have any questions. Other tests that may be performed during your first trimester include:  Blood tests to find your blood type and to check for the presence of any previous infections. They will also be used to check for low iron levels (anemia) and Rh antibodies. Later in the pregnancy, blood tests for diabetes will be done along with other tests if problems develop.  Urine tests to check for infections, diabetes, or protein in the urine.  An ultrasound to confirm the proper growth  and development of the baby.  An amniocentesis to check for possible genetic problems.  Fetal screens for spina bifida and Down syndrome.  You may need other tests to make sure you and the baby are doing well.  HIV (human immunodeficiency virus) testing. Routine prenatal testing includes screening for HIV, unless you choose not to have this test. HOME CARE INSTRUCTIONS  Medicines  Follow your health care provider's instructions regarding medicine use. Specific medicines may be either safe or unsafe to take during pregnancy.  Take your prenatal vitamins as directed.  If you develop constipation, try taking a stool softener if your health care provider approves. Diet  Eat regular, well-balanced meals. Choose a variety of foods, such as meat or vegetable-based protein, fish, milk and low-fat dairy products, vegetables, fruits, and whole grain breads and cereals. Your health care provider will help you determine the amount of weight gain that is right for you.  Avoid raw meat and uncooked cheese. These carry germs that can cause birth defects in the baby.  Eating four or five small meals rather than three large meals a day may help relieve nausea and vomiting. If you start to feel nauseous, eating a few soda crackers can be helpful. Drinking liquids between meals instead of during meals also seems to help nausea and vomiting.  If you develop constipation, eat more high-fiber foods, such as fresh vegetables or fruit and whole grains. Drink enough fluids to keep your urine clear or pale yellow. Activity and Exercise  Exercise only as directed by your health care provider. Exercising will help you:  Control your weight.  Stay in shape.  Be prepared for labor and delivery.  Experiencing pain or cramping in the lower abdomen or low back is a good sign that you should stop exercising. Check with your health care provider before continuing normal exercises.  Try to avoid standing for long  periods of time. Move your legs often if you must stand in one place for a long time.  Avoid heavy lifting.  Wear low-heeled shoes, and practice good posture.  You may continue to have sex unless your health care provider directs you otherwise. Relief of Pain or Discomfort  Wear a good support bra for breast tenderness.   Take warm sitz baths to soothe any pain or discomfort caused by hemorrhoids. Use hemorrhoid cream if your health care provider approves.   Rest with your legs elevated if you have leg cramps or low back pain.  If you develop varicose veins in your legs, wear support hose. Elevate your feet for 15 minutes, 3-4 times a day. Limit salt in your diet. Prenatal Care  Schedule your prenatal visits by the twelfth week of pregnancy. They are usually scheduled monthly at first, then more often in the last 2 months before delivery.  Write down your questions. Take them to your prenatal visits.  Keep all your prenatal visits as directed by your   health care provider. Safety  Wear your seat belt at all times when driving.  Make a list of emergency phone numbers, including numbers for family, friends, the hospital, and police and fire departments. General Tips  Ask your health care provider for a referral to a local prenatal education class. Begin classes no later than at the beginning of month 6 of your pregnancy.  Ask for help if you have counseling or nutritional needs during pregnancy. Your health care provider can offer advice or refer you to specialists for help with various needs.  Do not use hot tubs, steam rooms, or saunas.  Do not douche or use tampons or scented sanitary pads.  Do not cross your legs for long periods of time.  Avoid cat litter boxes and soil used by cats. These carry germs that can cause birth defects in the baby and possibly loss of the fetus by miscarriage or stillbirth.  Avoid all smoking, herbs, alcohol, and medicines not prescribed by  your health care provider. Chemicals in these affect the formation and growth of the baby.  Do not use any tobacco products, including cigarettes, chewing tobacco, and electronic cigarettes. If you need help quitting, ask your health care provider. You may receive counseling support and other resources to help you quit.  Schedule a dentist appointment. At home, brush your teeth with a soft toothbrush and be gentle when you floss. SEEK MEDICAL CARE IF:   You have dizziness.  You have mild pelvic cramps, pelvic pressure, or nagging pain in the abdominal area.  You have persistent nausea, vomiting, or diarrhea.  You have a bad smelling vaginal discharge.  You have pain with urination.  You notice increased swelling in your face, hands, legs, or ankles. SEEK IMMEDIATE MEDICAL CARE IF:   You have a fever.  You are leaking fluid from your vagina.  You have spotting or bleeding from your vagina.  You have severe abdominal cramping or pain.  You have rapid weight gain or loss.  You vomit blood or material that looks like coffee grounds.  You are exposed to [place] measles and have never had them.  You are exposed to fifth disease or chickenpox.  You develop a severe headache.  You have shortness of breath.  You have any kind of trauma, such as from a fall or a car accident.   This information is not intended to replace advice given to you by your health care provider. Make sure you discuss any questions you have with your health care provider.   Document Released: 10/20/2001 Document Revised: 11/16/2014 Document Reviewed: 09/05/2013 Elsevier Interactive Patient Education 2016 [employer] Clindamycin capsules What is this medicine? CLINDAMYCIN (KLIN da MYE sin) is a lincosamide antibiotic. It is used to treat certain kinds of bacterial infections. It will not work for colds, flu, or other viral infections. This medicine may be used for other purposes; ask your health care  provider or pharmacist if you have questions. What should I tell my health care provider before I take this medicine? They need to know if you have any of these conditions: -kidney disease -liver disease -stomach problems like colitis -an unusual or allergic reaction to clindamycin, lincomycin, or other medicines, foods, dyes like tartrazine or preservatives -pregnant or trying to get pregnant -breast-feeding How should I use this medicine? Take this medicine by mouth with a full glass of water. Follow the directions on the prescription label. You can take this medicine with food or on an empty stomach.  If the medicine upsets your stomach, take it with food. Take your medicine at regular intervals. Do not take your medicine more often than directed. Take all of your medicine as directed even if you think your are better. Do not skip doses or stop your medicine early. Talk to your pediatrician regarding the use of this medicine in children. Special care may be needed. Overdosage: If you think you have taken too much of this medicine contact a poison control center or emergency room at once. NOTE: This medicine is only for you. Do not share this medicine with others. What if I miss a dose? If you miss a dose, take it as soon as you can. If it is almost time for your next dose, take only that dose. Do not take double or extra doses. What may interact with this medicine? -birth control pills -chloramphenicol -erythromycin -kaolin products This list may not describe all possible interactions. Give your health care provider a list of all the medicines, herbs, non-prescription drugs, or dietary supplements you use. Also tell them if you smoke, drink alcohol, or use illegal drugs. Some items may interact with your medicine. What should I watch for while using this medicine? Tell your doctor or healthcare professional if your symptoms do not start to get better or if they get worse. Do not treat  diarrhea with over the counter products. Contact your doctor if you have diarrhea that lasts more than 2 days or if it is severe and watery. What side effects may I notice from receiving this medicine? Side effects that you should report to your doctor or health care professional as soon as possible: -allergic reactions like skin rash, itching or hives, swelling of the face, lips, or tongue -dark urine -pain on swallowing -redness, blistering, peeling or loosening of the skin, including inside the mouth -unusual bleeding or bruising -unusually weak or tired -yellowing of eyes or skin Side effects that usually do not require medical attention (report to your doctor or health care professional if they continue or are bothersome): -diarrhea -itching in the rectal or genital area -joint pain -nausea, vomiting -stomach pain This list may not describe all possible side effects. Call your doctor for medical advice about side effects. You may report side effects to FDA at 1-800-FDA-1088. Where should I keep my medicine? Keep out of the reach of children. Store at room temperature between 20 and 25 degrees C (68 and 77 degrees F). Throw away any unused medicine after the expiration date. NOTE: This sheet is a summary. It may not cover all possible information. If you have questions about this medicine, talk to your doctor, pharmacist, or health care provider.    2016, Elsevier/Gold Standard. (2013-06-01 16:12:32)  Bacterial Vaginosis Bacterial vaginosis is a vaginal infection that occurs when the normal balance of bacteria in the vagina is disrupted. It results from an overgrowth of certain bacteria. This is the most common vaginal infection in women of childbearing age. Treatment is important to prevent complications, especially in pregnant women, as it can cause a premature delivery. CAUSES  Bacterial vaginosis is caused by an increase in harmful bacteria that are normally present in smaller  amounts in the vagina. Several different kinds of bacteria can cause bacterial vaginosis. However, the reason that the condition develops is not fully understood. RISK FACTORS Certain activities or behaviors can put you at an increased risk of developing bacterial vaginosis, including:  Having a new sex partner or multiple sex partners.  Douching.  Using an intrauterine device (IUD) for contraception. Women do not get bacterial vaginosis from toilet seats, bedding, swimming pools, or contact with objects around them. SIGNS AND SYMPTOMS  Some women with bacterial vaginosis have no signs or symptoms. Common symptoms include:  Grey vaginal discharge.  A fishlike odor with discharge, especially after sexual intercourse.  Itching or burning of the vagina and vulva.  Burning or pain with urination. DIAGNOSIS  Your health care provider will take a medical history and examine the vagina for signs of bacterial vaginosis. A sample of vaginal fluid may be taken. Your health care provider will look at this sample under a microscope to check for bacteria and abnormal cells. A vaginal pH test may also be done.  TREATMENT  Bacterial vaginosis may be treated with antibiotic medicines. These may be given in the form of a pill or a vaginal cream. A second round of antibiotics may be prescribed if the condition comes back after treatment. Because bacterial vaginosis increases your risk for sexually transmitted diseases, getting treated can help reduce your risk for chlamydia, gonorrhea, HIV, and herpes. HOME CARE INSTRUCTIONS   Only take over-the-counter or prescription medicines as directed by your health care provider.  If antibiotic medicine was prescribed, take it as directed. Make sure you finish it even if you start to feel better.  Tell all sexual partners that you have a vaginal infection. They should see their health care provider and be treated if they have problems, such as a mild rash or  itching.  During treatment, it is important that you follow these instructions:  Avoid sexual activity or use condoms correctly.  Do not douche.  Avoid alcohol as directed by your health care provider.  Avoid breastfeeding as directed by your health care provider. SEEK MEDICAL CARE IF:   Your symptoms are not improving after 3 days of treatment.  You have increased discharge or pain.  You have a fever. MAKE SURE YOU:   Understand these instructions.  Will watch your condition.  Will get help right away if you are not doing well or get worse. FOR MORE INFORMATION  Centers for Disease Control and Prevention, Division of STD Prevention: [employer].co.zawww.cdc.gov/std American Sexual Health Association (ASHA): www.ashastd.org    This information is not intended to replace advice given to you by your health care provider. Make sure you discuss any questions you have with your health care provider.   Document Released: 10/26/2005 Document Revised: 11/16/2014 Document Reviewed: 06/07/2013 Elsevier Interactive Patient Education [employer]2016 [employer] Inc.

## 2016-09-05 LAB — HCG, QUANTITATIVE, PREGNANCY: hCG, Beta Chain, Quant, S: 37199.3 m[IU]/mL — ABNORMAL HIGH

## 2016-09-07 ENCOUNTER — Other Ambulatory Visit: Payer: Self-pay

## 2016-09-07 ENCOUNTER — Other Ambulatory Visit: Payer: Self-pay | Admitting: *Deleted

## 2016-09-07 DIAGNOSIS — Z3491 Encounter for supervision of normal pregnancy, unspecified, first trimester: Secondary | ICD-10-CM

## 2016-09-07 DIAGNOSIS — N912 Amenorrhea, unspecified: Secondary | ICD-10-CM

## 2016-09-07 DIAGNOSIS — Z3490 Encounter for supervision of normal pregnancy, unspecified, unspecified trimester: Secondary | ICD-10-CM

## 2016-09-08 LAB — HCG, QUANTITATIVE, PREGNANCY: HCG, BETA CHAIN, QUANT, S: 49964.4 m[IU]/mL — AB

## 2016-09-09 ENCOUNTER — Other Ambulatory Visit: Payer: Self-pay | Admitting: Gynecology

## 2016-09-09 ENCOUNTER — Encounter: Payer: Self-pay | Admitting: Gynecology

## 2016-09-09 ENCOUNTER — Ambulatory Visit (INDEPENDENT_AMBULATORY_CARE_PROVIDER_SITE_OTHER): Payer: Self-pay

## 2016-09-09 ENCOUNTER — Ambulatory Visit (INDEPENDENT_AMBULATORY_CARE_PROVIDER_SITE_OTHER): Payer: Self-pay | Admitting: Gynecology

## 2016-09-09 VITALS — BP 132/78

## 2016-09-09 DIAGNOSIS — Z3A01 Less than 8 weeks gestation of pregnancy: Secondary | ICD-10-CM

## 2016-09-09 DIAGNOSIS — Z3490 Encounter for supervision of normal pregnancy, unspecified, unspecified trimester: Secondary | ICD-10-CM

## 2016-09-09 DIAGNOSIS — Z3491 Encounter for supervision of normal pregnancy, unspecified, first trimester: Secondary | ICD-10-CM

## 2016-09-09 NOTE — Patient Instructions (Signed)
First Trimester of Pregnancy The first trimester of pregnancy is from week 1 until the end of week 12 (months 1 through 3). A week after a sperm fertilizes an egg, the egg will implant on the wall of the uterus. This embryo will begin to develop into a baby. Genes from you and your partner are forming the baby. The female genes determine whether the baby is a boy or a girl. At 6-8 weeks, the eyes and face are formed, and the heartbeat can be seen on ultrasound. At the end of 12 weeks, all the baby's organs are formed.  Now that you are pregnant, you will want to do everything you can to have a healthy baby. Two of the most important things are to get good prenatal care and to follow your health care provider's instructions. Prenatal care is all the medical care you receive before the baby's birth. This care will help prevent, find, and treat any problems during the pregnancy and childbirth. BODY CHANGES Your body goes through many changes during pregnancy. The changes vary from woman to woman.   You may gain or lose a couple of pounds at first.  You may feel sick to your stomach (nauseous) and throw up (vomit). If the vomiting is uncontrollable, call your health care provider.  You may tire easily.  You may develop headaches that can be relieved by medicines approved by your health care provider.  You may urinate more often. Painful urination may mean you have a bladder infection.  You may develop heartburn as a result of your pregnancy.  You may develop constipation because certain hormones are causing the muscles that push waste through your intestines to slow down.  You may develop hemorrhoids or swollen, bulging veins (varicose veins).  Your breasts may begin to grow larger and become tender. Your nipples may stick out more, and the tissue that surrounds them (areola) may become darker.  Your gums may bleed and may be sensitive to brushing and flossing.  Dark spots or blotches (chloasma,  mask of pregnancy) may develop on your face. This will likely fade after the baby is born.  Your menstrual periods will stop.  You may have a loss of appetite.  You may develop cravings for certain kinds of food.  You may have changes in your emotions from day to day, such as being excited to be pregnant or being concerned that something may go wrong with the pregnancy and baby.  You may have more vivid and strange dreams.  You may have changes in your hair. These can include thickening of your hair, rapid growth, and changes in texture. Some women also have hair loss during or after pregnancy, or hair that feels dry or thin. Your hair will most likely return to normal after your baby is born. WHAT TO EXPECT AT YOUR PRENATAL VISITS During a routine prenatal visit:  You will be weighed to make sure you and the baby are growing normally.  Your blood pressure will be taken.  Your abdomen will be measured to track your baby's growth.  The fetal heartbeat will be listened to starting around week 10 or 12 of your pregnancy.  Test results from any previous visits will be discussed. Your health care provider may ask you:  How you are feeling.  If you are feeling the baby move.  If you have had any abnormal symptoms, such as leaking fluid, bleeding, severe headaches, or abdominal cramping.  If you are using any tobacco products,   including cigarettes, chewing tobacco, and electronic cigarettes.  If you have any questions. Other tests that may be performed during your first trimester include:  Blood tests to find your blood type and to check for the presence of any previous infections. They will also be used to check for low iron levels (anemia) and Rh antibodies. Later in the pregnancy, blood tests for diabetes will be done along with other tests if problems develop.  Urine tests to check for infections, diabetes, or protein in the urine.  An ultrasound to confirm the proper growth  and development of the baby.  An amniocentesis to check for possible genetic problems.  Fetal screens for spina bifida and Down syndrome.  You may need other tests to make sure you and the baby are doing well.  HIV (human immunodeficiency virus) testing. Routine prenatal testing includes screening for HIV, unless you choose not to have this test. HOME CARE INSTRUCTIONS  Medicines  Follow your health care provider's instructions regarding medicine use. Specific medicines may be either safe or unsafe to take during pregnancy.  Take your prenatal vitamins as directed.  If you develop constipation, try taking a stool softener if your health care provider approves. Diet  Eat regular, well-balanced meals. Choose a variety of foods, such as meat or vegetable-based protein, fish, milk and low-fat dairy products, vegetables, fruits, and whole grain breads and cereals. Your health care provider will help you determine the amount of weight gain that is right for you.  Avoid raw meat and uncooked cheese. These carry germs that can cause birth defects in the baby.  Eating four or five small meals rather than three large meals a day may help relieve nausea and vomiting. If you start to feel nauseous, eating a few soda crackers can be helpful. Drinking liquids between meals instead of during meals also seems to help nausea and vomiting.  If you develop constipation, eat more high-fiber foods, such as fresh vegetables or fruit and whole grains. Drink enough fluids to keep your urine clear or pale yellow. Activity and Exercise  Exercise only as directed by your health care provider. Exercising will help you:  Control your weight.  Stay in shape.  Be prepared for labor and delivery.  Experiencing pain or cramping in the lower abdomen or low back is a good sign that you should stop exercising. Check with your health care provider before continuing normal exercises.  Try to avoid standing for long  periods of time. Move your legs often if you must stand in one place for a long time.  Avoid heavy lifting.  Wear low-heeled shoes, and practice good posture.  You may continue to have sex unless your health care provider directs you otherwise. Relief of Pain or Discomfort  Wear a good support bra for breast tenderness.   Take warm sitz baths to soothe any pain or discomfort caused by hemorrhoids. Use hemorrhoid cream if your health care provider approves.   Rest with your legs elevated if you have leg cramps or low back pain.  If you develop varicose veins in your legs, wear support hose. Elevate your feet for 15 minutes, 3-4 times a day. Limit salt in your diet. Prenatal Care  Schedule your prenatal visits by the twelfth week of pregnancy. They are usually scheduled monthly at first, then more often in the last 2 months before delivery.  Write down your questions. Take them to your prenatal visits.  Keep all your prenatal visits as directed by your   health care provider. Safety  Wear your seat belt at all times when driving.  Make a list of emergency phone numbers, including numbers for family, friends, the hospital, and police and fire departments. General Tips  Ask your health care provider for a referral to a local prenatal education class. Begin classes no later than at the beginning of month 6 of your pregnancy.  Ask for help if you have counseling or nutritional needs during pregnancy. Your health care provider can offer advice or refer you to specialists for help with various needs.  Do not use hot tubs, steam rooms, or saunas.  Do not douche or use tampons or scented sanitary pads.  Do not cross your legs for long periods of time.  Avoid cat litter boxes and soil used by cats. These carry germs that can cause birth defects in the baby and possibly loss of the fetus by miscarriage or stillbirth.  Avoid all smoking, herbs, alcohol, and medicines not prescribed by  your health care provider. Chemicals in these affect the formation and growth of the baby.  Do not use any tobacco products, including cigarettes, chewing tobacco, and electronic cigarettes. If you need help quitting, ask your health care provider. You may receive counseling support and other resources to help you quit.  Schedule a dentist appointment. At home, brush your teeth with a soft toothbrush and be gentle when you floss. SEEK MEDICAL CARE IF:   You have dizziness.  You have mild pelvic cramps, pelvic pressure, or nagging pain in the abdominal area.  You have persistent nausea, vomiting, or diarrhea.  You have a bad smelling vaginal discharge.  You have pain with urination.  You notice increased swelling in your face, hands, legs, or ankles. SEEK IMMEDIATE MEDICAL CARE IF:   You have a fever.  You are leaking fluid from your vagina.  You have spotting or bleeding from your vagina.  You have severe abdominal cramping or pain.  You have rapid weight gain or loss.  You vomit blood or material that looks like coffee grounds.  You are exposed to German measles and have never had them.  You are exposed to fifth disease or chickenpox.  You develop a severe headache.  You have shortness of breath.  You have any kind of trauma, such as from a fall or a car accident.   This information is not intended to replace advice given to you by your health care provider. Make sure you discuss any questions you have with your health care provider.   Document Released: 10/20/2001 Document Revised: 11/16/2014 Document Reviewed: 09/05/2013 Elsevier Interactive Patient Education 2016 [employer]  

## 2016-09-09 NOTE — Progress Notes (Signed)
Patient ID: Cassandra CashLeslie A Mesquita, female   DOB: 11-12-1990, 25 y.o.   MRN: [id number]     Patient is a 4826 sure oh now gravida 2 para 1 who was seen in the office on October 27 as a result of her amenorrhea and positive urine home pregnancy test. Patient with past history of chronic anovulatiion and PCOS who had reported only having had 2 menstrual cycles in the past year. She had some nausea and was prescribed Reglan 10 mg 3 times a day when necessary along with multiple small feedings throughout the day. She is asymptomatic today reports no vaginal bleeding she is here to discuss her ultrasound as well as her quantitative beta hCGs as follows:  Results for Cassandra CashMALAVESANTANA, Tyresha A (MRN [id number]) as of 09/09/2016 10:44  Ref. Range 09/04/2016 14:38 09/04/2016 14:53 09/07/2016 16:25  HCG, Beta Chain, Quant, S Latest Units: mIU/mL 37,199.3 (H)  49,964.4 (H)    Ultrasound: Viable intrauterine pregnancy at 7 weeks and one day with the crest take date of confinement 04/27/2017 cardiac activity recorded at 145 bpm. Fetal pole was seen. Right ovary normal. Left ovary corpus luteum cyst with a 5 x 23 mm. Cervix long closed. No fluid in the cul-de-sac.  Assessment/plan: First trimester viable intrauterine pregnancy with rising quantitative beta-hCG. Patient asymptomatic doing well. Patient will be referred to our obstetrical colleagues to take care of her during her pregnancy. A copy of the ultrasound was provided.

## 2016-09-22 ENCOUNTER — Other Ambulatory Visit: Payer: Self-pay | Admitting: Obstetrics and Gynecology

## 2016-09-22 DIAGNOSIS — Z8751 Personal history of pre-term labor: Secondary | ICD-10-CM | POA: Insufficient documentation

## 2016-09-22 HISTORY — DX: Personal history of pre-term labor: Z87.51

## 2016-09-22 LAB — OB RESULTS CONSOLE RUBELLA ANTIBODY, IGM: Rubella: IMMUNE

## 2016-09-22 LAB — OB RESULTS CONSOLE HEPATITIS B SURFACE ANTIGEN: Hepatitis B Surface Ag: NEGATIVE

## 2016-09-23 LAB — CYTOLOGY - PAP

## 2016-11-09 DIAGNOSIS — O2230 Deep phlebothrombosis in pregnancy, unspecified trimester: Secondary | ICD-10-CM

## 2016-11-09 HISTORY — DX: Deep phlebothrombosis in pregnancy, unspecified trimester: O22.30

## 2016-11-09 NOTE — L&D Delivery Note (Signed)
Delivery Note After a 30 minute 2nd stageAt 1:04 AM a viable female was delivered via Vaginal, Spontaneous Delivery (Presentation: ROP;  ).  APGAR: 8, 9; weight 4 lb 3.7 oz (1920 g).  After 1 minute, the cord was clamped and cut. 40 units of pitocin diluted in 1000cc LR was infused rapidly IV.  The placenta separated spontaneously and delivered via CCT and maternal pushing effort.  It was inspected and appears to be intact with a 3 VC.  Anesthesia:   Episiotomy: None Lacerations:   Suture Repair:  Est. Blood Loss (mL):  100  Mom to postpartum.  Baby to NICU.  The above was by Jessy Oto under my direct supervision CRESENZO-DISHMAN,Vondell Sowell 03/05/2017, 1:31 AM

## 2016-11-10 ENCOUNTER — Encounter (HOSPITAL_COMMUNITY): Payer: Self-pay

## 2016-11-10 ENCOUNTER — Other Ambulatory Visit: Payer: Self-pay | Admitting: Obstetrics and Gynecology

## 2016-11-10 ENCOUNTER — Other Ambulatory Visit (HOSPITAL_COMMUNITY): Payer: Self-pay | Admitting: Obstetrics & Gynecology

## 2016-11-10 ENCOUNTER — Ambulatory Visit (HOSPITAL_COMMUNITY): Admission: RE | Admit: 2016-11-10 | Payer: Medicaid Other | Source: Ambulatory Visit

## 2016-11-10 ENCOUNTER — Ambulatory Visit (HOSPITAL_COMMUNITY)
Admission: RE | Admit: 2016-11-10 | Discharge: 2016-11-10 | Disposition: A | Discharge status: Home / Self Care | Payer: Medicaid Other | Source: Ambulatory Visit | Attending: Obstetrics & Gynecology | Admitting: Obstetrics & Gynecology

## 2016-11-10 ENCOUNTER — Encounter (HOSPITAL_COMMUNITY): Payer: Self-pay | Admitting: *Deleted

## 2016-11-10 DIAGNOSIS — Z3A16 16 weeks gestation of pregnancy: Secondary | ICD-10-CM | POA: Diagnosis not present

## 2016-11-10 DIAGNOSIS — O3432 Maternal care for cervical incompetence, second trimester: Secondary | ICD-10-CM | POA: Diagnosis not present

## 2016-11-10 DIAGNOSIS — O26879 Cervical shortening, unspecified trimester: Secondary | ICD-10-CM

## 2016-11-11 ENCOUNTER — Encounter (HOSPITAL_COMMUNITY): Payer: Self-pay | Admitting: Certified Registered Nurse Anesthetist

## 2016-11-11 ENCOUNTER — Other Ambulatory Visit (HOSPITAL_COMMUNITY): Payer: Self-pay | Admitting: *Deleted

## 2016-11-11 ENCOUNTER — Ambulatory Visit (HOSPITAL_COMMUNITY)
Admission: RE | Admit: 2016-11-11 | Discharge: 2016-11-11 | Disposition: A | Discharge status: Home / Self Care | Payer: Medicaid Other | Source: Ambulatory Visit | Attending: Obstetrics and Gynecology | Admitting: Obstetrics and Gynecology

## 2016-11-11 ENCOUNTER — Ambulatory Visit (HOSPITAL_COMMUNITY): Payer: Medicaid Other | Admitting: Anesthesiology

## 2016-11-11 ENCOUNTER — Encounter (HOSPITAL_COMMUNITY)
Admission: RE | Disposition: A | Discharge status: Home / Self Care | Payer: Self-pay | Source: Ambulatory Visit | Attending: Obstetrics and Gynecology

## 2016-11-11 DIAGNOSIS — Z79899 Other long term (current) drug therapy: Secondary | ICD-10-CM | POA: Insufficient documentation

## 2016-11-11 DIAGNOSIS — Z3A16 16 weeks gestation of pregnancy: Secondary | ICD-10-CM | POA: Insufficient documentation

## 2016-11-11 DIAGNOSIS — O3432 Maternal care for cervical incompetence, second trimester: Secondary | ICD-10-CM | POA: Insufficient documentation

## 2016-11-11 HISTORY — PX: CERVICAL CERCLAGE: SHX1329

## 2016-11-11 LAB — CBC
HCT: 32.5 % — ABNORMAL LOW (ref 36.0–46.0)
HEMOGLOBIN: 11 g/dL — AB (ref 12.0–15.0)
MCH: 29.3 pg (ref 26.0–34.0)
MCHC: 33.8 g/dL (ref 30.0–36.0)
MCV: 86.7 fL (ref 78.0–100.0)
Platelets: 247 10*3/uL (ref 150–400)
RBC: 3.75 MIL/uL — ABNORMAL LOW (ref 3.87–5.11)
RDW: 12.7 % (ref 11.5–15.5)
WBC: 8.8 10*3/uL (ref 4.0–10.5)

## 2016-11-11 SURGERY — CERCLAGE, CERVIX, VAGINAL APPROACH
Anesthesia: Spinal | Site: Vagina

## 2016-11-11 MED ORDER — PROMETHAZINE HCL 25 MG/ML IJ SOLN: 6.2500 mg | INTRAMUSCULAR | Status: DC | PRN

## 2016-11-11 MED ORDER — LACTATED RINGERS IV SOLN
INTRAVENOUS | Status: DC
  Administered 2016-11-11: 1000 mL via INTRAVENOUS

## 2016-11-11 MED ORDER — CEFAZOLIN SODIUM-DEXTROSE 2-3 GM-% IV SOLR: INTRAVENOUS | Status: DC | PRN

## 2016-11-11 MED ORDER — BUPIVACAINE IN DEXTROSE 0.75-8.25 % IT SOLN
INTRATHECAL | Status: DC | PRN
  Administered 2016-11-11: 1.4 mL via INTRATHECAL

## 2016-11-11 MED ORDER — CEFAZOLIN SODIUM-DEXTROSE 2-3 GM-% IV SOLR
INTRAVENOUS | Status: DC | PRN
  Administered 2016-11-11: 2 g via INTRAVENOUS

## 2016-11-11 MED ORDER — FENTANYL CITRATE (PF) 100 MCG/2ML IJ SOLN: 25.0000 ug | INTRAMUSCULAR | Status: DC | PRN

## 2016-11-11 SURGICAL SUPPLY — 20 items
CANISTER SUCT 3000ML (MISCELLANEOUS) ×3 IMPLANT
CATH FOLEY 2WAY SLVR 30CC 16FR (CATHETERS) IMPLANT
CLOTH BEACON ORANGE TIMEOUT ST (SAFETY) ×3 IMPLANT
COUNTER NEEDLE 1200 MAGNETIC (NEEDLE) ×2 IMPLANT
GLOVE BIOGEL PI IND STRL 7.0 (GLOVE) ×1 IMPLANT
GLOVE BIOGEL PI INDICATOR 7.0 (GLOVE) ×2
GLOVE ECLIPSE 7.0 STRL STRAW (GLOVE) ×6 IMPLANT
GOWN STRL REUS W/TWL LRG LVL3 (GOWN DISPOSABLE) ×9 IMPLANT
NS IRRIG 1000ML POUR BTL (IV SOLUTION) ×3 IMPLANT
PACK VAGINAL MINOR WOMEN LF (CUSTOM PROCEDURE TRAY) ×3 IMPLANT
PAD OB MATERNITY 4.3X12.25 (PERSONAL CARE ITEMS) ×3 IMPLANT
PAD PREP 24X48 CUFFED NSTRL (MISCELLANEOUS) ×3 IMPLANT
SUT PROLENE 1 CT 1 30 (SUTURE) ×3 IMPLANT
SYR 30ML LL (SYRINGE) IMPLANT
TOWEL OR 17X24 6PK STRL BLUE (TOWEL DISPOSABLE) ×6 IMPLANT
TRAY FOLEY CATH SILVER 14FR (SET/KITS/TRAYS/PACK) ×3 IMPLANT
TUBING NON-CON 1/4 X 20 CONN (TUBING) IMPLANT
TUBING NON-CON 1/4 X 20' CONN (TUBING)
WATER STERILE IRR 1000ML POUR (IV SOLUTION) ×3 IMPLANT
YANKAUER SUCT BULB TIP NO VENT (SUCTIONS) IMPLANT

## 2016-11-11 NOTE — Transfer of Care (Signed)
Immediate Anesthesia Transfer of Care Note  Patient: Marva PandaLeslie A Nickelson  Procedure(s) Performed: Procedure(s): CERCLAGE CERVICAL (N/A)  Patient Location: PACU  Anesthesia Type:Spinal  Level of Consciousness: awake, alert  and oriented  Airway & Oxygen Therapy: Patient Spontanous Breathing  Post-op Assessment: Report given to RN and Post -op Vital signs reviewed and stable  Post vital signs: Reviewed and stable  Last Vitals:  Vitals:   11/11/16 0828  BP: 113/71  Pulse: 85  Resp: 16  Temp: 36.9 C    Last Pain:  Vitals:   11/11/16 0828  TempSrc: Oral      Patients Stated Pain Goal: 2 (11/11/16 0828)  Complications: No apparent anesthesia complications

## 2016-11-11 NOTE — Anesthesia Postprocedure Evaluation (Signed)
Anesthesia Post Note  Patient: Cassandra Russell  Procedure(s) Performed: Procedure(s) (LRB): CERCLAGE CERVICAL (N/A)  Patient location during evaluation: PACU Anesthesia Type: Spinal Level of consciousness: awake and alert Pain management: pain level controlled Vital Signs Assessment: post-procedure vital signs reviewed and stable Respiratory status: spontaneous breathing and respiratory function stable Cardiovascular status: blood pressure returned to baseline and stable Postop Assessment: spinal receding Anesthetic complications: no        Last Vitals:  Vitals:   11/11/16 1145 11/11/16 1200  BP: 106/60 (!) 109/53  Pulse: 83 85  Resp: 19 18  Temp:      Last Pain:  Vitals:   11/11/16 1030  TempSrc:   PainSc: 1    Pain Goal: Patients Stated Pain Goal: 2 (11/11/16 1130)               Kennieth RadFitzgerald, Aniaya Bacha E

## 2016-11-11 NOTE — Anesthesia Preprocedure Evaluation (Addendum)
Anesthesia Evaluation  Patient identified by MRN, date of birth, ID band Patient awake    Reviewed: Allergy & Precautions, NPO status , Patient's Chart, lab work & pertinent test results  Airway Mallampati: II  TM Distance: >3 FB Neck ROM: Full    Dental   Pulmonary neg pulmonary ROS,    breath sounds clear to auscultation       Cardiovascular + dysrhythmias Supra Ventricular Tachycardia  Rhythm:Regular Rate:Normal  Hx SVT   Neuro/Psych negative neurological ROS     GI/Hepatic negative GI ROS, Neg liver ROS,   Endo/Other  negative endocrine ROS  Renal/GU negative Renal ROS     Musculoskeletal   Abdominal   Peds  Hematology negative hematology ROS (+)   Anesthesia Other Findings   Reproductive/Obstetrics (+) Pregnancy                             Lab Results  Component Value Date   WBC 8.8 11/11/2016   HGB 11.0 (L) 11/11/2016   HCT 32.5 (L) 11/11/2016   MCV 86.7 11/11/2016   PLT 247 11/11/2016    Anesthesia Physical Anesthesia Plan  ASA: II  Anesthesia Plan: Spinal   Post-op Pain Management:    Induction:   Airway Management Planned: Natural Airway and Simple Face Mask  Additional Equipment:   Intra-op Plan:   Post-operative Plan:   Informed Consent: I have reviewed the patients History and Physical, chart, labs and discussed the procedure including the risks, benefits and alternatives for the proposed anesthesia with the patient or authorized representative who has indicated his/her understanding and acceptance.     Plan Discussed with: CRNA  Anesthesia Plan Comments:         Anesthesia Quick Evaluation

## 2016-11-11 NOTE — Anesthesia Procedure Notes (Signed)
Spinal  Staffing Anesthesiologist: Melvie Paglia Performed: anesthesiologist  Preanesthetic Checklist Completed: patient identified, site marked, surgical consent, pre-op evaluation, timeout performed, IV checked, risks and benefits discussed and monitors and equipment checked Spinal Block Patient position: sitting Prep: site prepped and draped and DuraPrep Patient monitoring: heart rate, continuous pulse ox and blood pressure Approach: midline Location: L4-5 Injection technique: single-shot Needle Needle type: Pencan  Needle gauge: 24 G Needle length: 9 cm Needle insertion depth: 5 cm     

## 2016-11-11 NOTE — H&P (Signed)
Cassandra Russell is an 26 y.o. G2P0101 [id number] black female who presents to the OR for a cervical cerclage. Pt has a h.o. A premature delivery. She was having an u/s yesterday and was found to have a shortened cx with funneling. She was seen by MFM and it was suggested that she have a cerclage.  Chief Complaint: HPI:  Past Medical History:  Diagnosis Date  . Chlamydia   . Conjunctivitis   . SVT (supraventricular tachycardia) (HCC) 10/2013   Adenosine was given in the ED ([facility]), pt was not admitted.    Past Surgical History:  Procedure Laterality Date  . CLOSED REDUCTION FINGER WITH PERCUTANEOUS PINNING Left 01/24/2014   Procedure: LEFT SMALL FINGER CLOSED REDUCTION WITH PINNING/POSSIBLE ORIF;  Surgeon: Sharma CovertFred W Ortmann, MD;  Location: [facility] OR;  Service: Orthopedics;  Laterality: Left;  . INTRAUTERINE DEVICE INSERTION  2009  . WISDOM TOOTH EXTRACTION      Family History  Problem Relation Age of Onset  . Hypertension Mother    Social History:  reports that she has never smoked. She has never used smokeless tobacco. She reports that she does not drink alcohol or use drugs.  Allergies: No Known Allergies  Medications Prior to Admission  Medication Sig Dispense Refill  . hydroxyprogesterone caproate (MAKENA) 250 mg/mL OIL injection Inject 250 mg into the muscle once a week.     . Prenatal Vit-Fe Fum-FA-Omega (ONE-A-DAY WOMENS PRENATAL PO) Take 1 tablet by mouth daily.    . clindamycin (CLEOCIN) 300 MG capsule Take one BID for 7 days (Patient not taking: Reported on 11/10/2016) 14 capsule 0       Blood pressure 113/71, pulse 85, temperature 98.4 F (36.9 C), temperature source Oral, resp. rate 16, last menstrual period 07/21/2016, SpO2 100 %. General appearance: alert and cooperative Lungs: clear to auscultation bilaterally Heart: regular rate and rhythm, S1, S2 normal, no murmur, click, rub or gallop Abdomen: soft, non-tender; bowel sounds normal; no masses,  no organomegaly   Gravid.  Lab Results  Component Value Date   WBC 8.8 11/11/2016   HGB 11.0 (L) 11/11/2016   HCT 32.5 (L) 11/11/2016   MCV 86.7 11/11/2016   PLT 247 11/11/2016   Lab Results  Component Value Date   PREGTESTUR POSITIVE (A) 09/04/2016   PREGSERUM NEGATIVE 01/22/2014       Patient Active Problem List   Diagnosis Date Noted  . Less than [redacted] weeks gestation of pregnancy 09/09/2016  . Pregnancy 09/04/2016  . History of supraventricular tachycardia 09/24/2015  . PCOS (polycystic ovarian syndrome) 10/31/2014  . Amenorrhea, secondary 10/01/2014  . Dysplasia of cervix, low grade (CIN 1) 08/11/2012  . History of chlamydia 04/13/2012   IMP/ IUP at 16 wks with shortened cx and funneling Plan/ Proceed with Cx Cerclage  ANDERSON,MARK E 11/11/2016, 8:55 AM

## 2016-11-12 ENCOUNTER — Encounter (HOSPITAL_COMMUNITY): Payer: Self-pay | Admitting: Obstetrics and Gynecology

## 2016-11-12 NOTE — Op Note (Signed)
[facility] Comment:  Behney, Rossetta        ACCOUNT NO.:  [id number]  MEDICAL RECORD NO.:  [id number]  LOCATION:                                FACILITY:  [facility]  PHYSICIAN:  Malva LimesMark Anderson, M.D.    DATE OF BIRTH:  07/16/1991  DATE OF PROCEDURE:  11/11/2016 DATE OF DISCHARGE:  11/11/2016                              OPERATIVE REPORT   PREOPERATIVE DIAGNOSES: 1. Intrauterine pregnancy at 16 weeks estimated gestational age. 2. Shortened cervix. 3. History of prior preterm delivery.  POSTOPERATIVE DIAGNOSES: 1. Intrauterine pregnancy at 16 weeks estimated gestational age. 2. Shortened cervix. 3. History of prior preterm delivery.  PROCEDURE:  [employer] cervical cerclage.  SURGEON:  Malva LimesMark Anderson, M.D.  ANESTHESIA:  Spinal.  ANTIBIOTICS:  Ancef 2 g.  ESTIMATED BLOOD LOSS:  Minimal.  SPECIMENS:  None.  COMPLICATIONS:  None.  PROCEDURE IN DETAIL:  The patient was taken to the operating room, where spinal anesthetic was administered.  Once this was accomplished, she was placed in a dorsal lithotomy position.  She was prepped and draped in the usual fashion for this procedure.  A weighted speculum was placed in the vagina.  One Prolene was used to perform the [employer] cerclage starting at 12 o'clock in the fornix and going counter-clockwise.  The McDonald cerclage was placed.  Prior to McDonald cerclage being tightened, the os was approximately 1 cm dilated following the procedure that was tightly closed.  The patient had a second loop thrown in the suture so that it can be easily grasped in the future.  The patient tolerated the procedure well. She was taken to the recovery room in stable condition.  Instrument and lap count was correct x2.  She will be discharged home.  She will follow up in the office in 1 week.  She will use Tylenol as needed.          ______________________________ Malva LimesMark Anderson, M.D.     MA/MEDQ  D:  11/11/2016  T:  11/11/2016  Job:  [id number]

## 2016-11-12 NOTE — Op Note (Signed)
[facility] Comment:  Halperin, Rylea        ACCOUNT NO.:  [id number]  MEDICAL RECORD NO.:  [id number]  LOCATION:                                FACILITY:  [facility]  PHYSICIAN:  Malva LimesMark Ilana Prezioso, M.D.    DATE OF BIRTH:  1991/01/06  DATE OF PROCEDURE:  11/11/2016 DATE OF DISCHARGE:  11/11/2016                              OPERATIVE REPORT   PREOPERATIVE DIAGNOSES: 1. Shortened cervix. 2. Intrauterine pregnancy at 16 weeks estimated gestational age. 3. History of prior preterm delivery.  POSTOPERATIVE DIAGNOSES: 1. Shortened cervix. 2. Intrauterine pregnancy at 16 weeks estimated gestational age. 3. History of prior preterm delivery.  PROCEDURE:  [employer] cervical cerclage.  SURGEON:  Malva LimesMark Cobey Raineri, M.D.  ANESTHESIA:  Spinal.  ANTIBIOTICS:  Ancef 2 g.  DRAINS:  Foley at bedside drainage.  ESTIMATED BLOOD LOSS:  Minimal.  COMPLICATIONS:  None.  PROCEDURE IN DETAIL:  The patient was taken  Dictation ended at this point.          ______________________________ Malva LimesMark Fadumo Heng, M.D.     MA/MEDQ  D:  11/11/2016  T:  11/11/2016  Job:  [id number]

## 2016-11-14 ENCOUNTER — Encounter (HOSPITAL_COMMUNITY): Payer: Self-pay | Admitting: *Deleted

## 2016-11-14 ENCOUNTER — Inpatient Hospital Stay (HOSPITAL_COMMUNITY): Payer: Medicaid Other | Admitting: Anesthesiology

## 2016-11-14 ENCOUNTER — Inpatient Hospital Stay (HOSPITAL_COMMUNITY)
Admission: AD | Admit: 2016-11-14 | Discharge: 2016-11-14 | Disposition: A | Discharge status: Home / Self Care | Payer: Medicaid Other | Source: Ambulatory Visit | Attending: Obstetrics and Gynecology | Admitting: Obstetrics and Gynecology

## 2016-11-14 DIAGNOSIS — Z3A16 16 weeks gestation of pregnancy: Secondary | ICD-10-CM | POA: Insufficient documentation

## 2016-11-14 DIAGNOSIS — I471 Supraventricular tachycardia: Secondary | ICD-10-CM | POA: Diagnosis not present

## 2016-11-14 DIAGNOSIS — O26892 Other specified pregnancy related conditions, second trimester: Secondary | ICD-10-CM | POA: Insufficient documentation

## 2016-11-14 DIAGNOSIS — G971 Other reaction to spinal and lumbar puncture: Secondary | ICD-10-CM | POA: Diagnosis not present

## 2016-11-14 DIAGNOSIS — O99412 Diseases of the circulatory system complicating pregnancy, second trimester: Secondary | ICD-10-CM | POA: Diagnosis not present

## 2016-11-14 DIAGNOSIS — R51 Headache: Secondary | ICD-10-CM | POA: Insufficient documentation

## 2016-11-14 MED ORDER — CAFFEINE-SODIUM BENZOATE 125-125 MG/ML IJ SOLN: 500.0000 mg | Freq: Once | INTRAMUSCULAR | Status: DC

## 2016-11-14 MED ORDER — BUTALBITAL-APAP-CAFFEINE 50-325-40 MG PO TABS
2.0000 | ORAL_TABLET | Freq: Once | ORAL | Status: AC
  Administered 2016-11-14: 2 via ORAL
  Filled 2016-11-14: qty 2

## 2016-11-14 MED ORDER — LACTATED RINGERS IV BOLUS (SEPSIS)
500.0000 mL | Freq: Once | INTRAVENOUS | Status: AC
  Administered 2016-11-14: 500 mL via INTRAVENOUS

## 2016-11-14 MED ORDER — LACTATED RINGERS IV SOLN
500.0000 mg | Freq: Once | INTRAVENOUS | Status: AC
  Administered 2016-11-14: 500 mg via INTRAVENOUS
  Filled 2016-11-14: qty 2

## 2016-11-14 NOTE — Anesthesia Postprocedure Evaluation (Signed)
Anesthesia Post Note  Patient: Cassandra Russell  Procedure(s) Performed: * No procedures listed *  Patient location during evaluation: MAU Level of consciousness: awake Pain management: pain level controlled Vital Signs Assessment: post-procedure vital signs reviewed and stable Respiratory status: spontaneous breathing Cardiovascular status: stable Postop Assessment: no headache Anesthetic complications: no Comments: Received caffeine infusion for post spinal headache.        Last Vitals:  Vitals:   11/14/16 1420 11/14/16 1653  BP: 139/79 115/62  Pulse: 92 101  Resp: 16 16  Temp: 36.8 C     Last Pain:  Vitals:   11/14/16 1653  PainSc: 1    Pain Goal:                 Chevelle Coulson JR,JOHN Jaedan Schuman

## 2016-11-14 NOTE — MAU Provider Note (Signed)
History   G2P0101 @ 16.4 wks S/P cerclage on Wednesday in with headache that started Thursday. States headache is frontal, herendous pressure and is severe when upright and resolves when lying flat.  CSN: [id number]  Arrival date & time 11/14/16  1351   None     Chief Complaint  Patient presents with  . Headache    HPI  Past Medical History:  Diagnosis Date  . Chlamydia   . Conjunctivitis   . SVT (supraventricular tachycardia) (HCC) 10/2013   Adenosine was given in the ED ([facility]), pt was not admitted.    Past Surgical History:  Procedure Laterality Date  . CERVICAL CERCLAGE N/A 11/11/2016   Procedure: CERCLAGE CERVICAL;  Surgeon: Levi AlandMark E Anderson, MD;  Location: [facility];  Service: Gynecology;  Laterality: N/A;  . CLOSED REDUCTION FINGER WITH PERCUTANEOUS PINNING Left 01/24/2014   Procedure: LEFT SMALL FINGER CLOSED REDUCTION WITH PINNING/POSSIBLE ORIF;  Surgeon: Sharma CovertFred W Ortmann, MD;  Location: [facility];  Service: Orthopedics;  Laterality: Left;  . INTRAUTERINE DEVICE INSERTION  2009  . WISDOM TOOTH EXTRACTION      Family History  Problem Relation Age of Onset  . Hypertension Mother     Social History  Substance Use Topics  . Smoking status: Never Smoker  . Smokeless tobacco: Never Used  . Alcohol use No    OB History    Gravida Para Term Preterm AB Living   2 1   1   1    SAB TAB Ectopic Multiple Live Births                  Review of Systems  Constitutional: Negative.   HENT: Negative.   Eyes: Negative.   Respiratory: Negative.   Cardiovascular: Negative.   Gastrointestinal: Negative.   Endocrine: Negative.   Genitourinary: Negative.   Musculoskeletal: Negative.   Skin: Negative.   Neurological: Positive for headaches.  Hematological: Negative.   Psychiatric/Behavioral: Negative.     Allergies  Patient has no known allergies.  Home Medications    BP 139/79   Pulse 92   Temp 98.3 F (36.8 C)   Resp 16   LMP 07/21/2016   Physical Exam   Constitutional: She is oriented to person, place, and time. She appears well-developed and well-nourished.  HENT:  Head: Normocephalic.  Eyes: Pupils are equal, round, and reactive to light.  Neck: Normal range of motion.  Cardiovascular: Normal rate, regular rhythm, normal heart sounds and intact distal pulses.   Pulmonary/Chest: Effort normal and breath sounds normal.  Abdominal: Soft. Bowel sounds are normal.  Musculoskeletal: Normal range of motion.  Neurological: She is alert and oriented to person, place, and time. She has normal reflexes.  Skin: Skin is warm and dry.  Psychiatric: She has a normal mood and affect. Her behavior is normal. Judgment and thought content normal.    MAU Course  Procedures (including critical care time)  Labs Reviewed - No data to display No results found.   No diagnosis found.    MDM  Spinal headache, Orders obtained from Dr. Lilli LightHatchet. VSS, headache relieved with lying flat. Dr. Lilli LightHatchet in to evaluate pt.

## 2016-11-14 NOTE — Anesthesia Preprocedure Evaluation (Signed)
Anesthesia Evaluation  Patient identified by MRN, date of birth, ID band Patient awake    Reviewed: Allergy & Precautions, H&P , NPO status , Patient's Chart, lab work & pertinent test results  Airway Mallampati: I  TM Distance: >3 FB Neck ROM: full    Dental no notable dental hx. (+) Teeth Intact   Pulmonary neg pulmonary ROS,    Pulmonary exam normal        Cardiovascular negative cardio ROS Normal cardiovascular exam     Neuro/Psych  Headaches, PDPH S/P 24G Sprotte for cervical cerclage on 11/11/16. H/A has been present for ~ 36 hrs. It is positional and frontal in nature. Pt has had an otherwise unremarkable recovery from her procedure. negative neurological ROS  negative psych ROS   GI/Hepatic negative GI ROS, Neg liver ROS,   Endo/Other  negative endocrine ROS  Renal/GU negative Renal ROS     Musculoskeletal negative musculoskeletal ROS (+)   Abdominal Normal abdominal exam  (+)   Peds  Hematology negative hematology ROS (+)   Anesthesia Other Findings   Reproductive/Obstetrics negative OB ROS                             Anesthesia Physical Anesthesia Plan  ASA: II  Anesthesia Plan:    Post-op Pain Management:    Induction:   Airway Management Planned:   Additional Equipment:   Intra-op Plan:   Post-operative Plan:   Informed Consent:   Dental Advisory Given and History available from chart only  Plan Discussed with: CRNA  Anesthesia Plan Comments: (500mg  caffeine sodium benzoate in 1lLR infuse over one hr, then reassess)        Anesthesia Quick Evaluation

## 2016-11-14 NOTE — Discharge Instructions (Signed)
Analgesic Rebound Headache An analgesic rebound headache, sometimes called a medication overuse headache, is a headache that comes after pain medicine (analgesic) taken to treat the original (primary) headache has worn off. Any type of primary headache can return as a rebound headache if a person regularly takes analgesics more than three times a week to treat it. The types of primary headaches that are commonly associated with rebound headaches include:  Migraines.  Headaches that arise from tense muscles in the head and neck area (tension headaches).  Headaches that develop and happen again (recur) on one side of the head and around the eye (cluster headaches). If rebound headaches continue, they become chronic daily headaches. What are the causes? This condition may be caused by frequent use of:  Over-the-counter medicines such as aspirin, ibuprofen, and acetaminophen.  Sinus relief medicines and other medicines that contain caffeine.  Narcotic pain medicines such as codeine and oxycodone. What are the signs or symptoms? The symptoms of a rebound headache are the same as the symptoms of the original headache. Some of the symptoms of specific types of headaches include: Migraine headache   Pulsing or throbbing pain on one or both sides of the head.  Severe pain that interferes with daily activities.  Pain that is worsened by physical activity.  Nausea, vomiting, or both.  Pain with exposure to bright light, loud noises, or strong smells.  General sensitivity to bright light, loud noises, or strong smells.  Visual changes.  Numbness of one or both arms. Tension headache   Pressure around the head.  Dull, aching head pain.  Pain felt over the front and sides of the head.  Tenderness in the muscles of the head, neck, and shoulders. Cluster headache   Severe pain that begins in or around one eye or temple.  Redness and tearing in the eye on the same side as the  pain.  Droopy or swollen eyelid.  One-sided head pain.  Nausea.  Runny nose.  Sweaty, pale facial skin.  Restlessness. How is this diagnosed? This condition is diagnosed by:  Reviewing your medical history. This includes the nature of your primary headaches.  Reviewing the types of pain medicines that you have been using to treat your headaches and how often you take them. How is this treated? This condition may be treated or managed by:  Discontinuing frequent use of the analgesic medicine. Doing this may worsen your headaches at first, but the pain should eventually become more manageable, less frequent, and less severe.  Seeing a headache specialist. He or she may be able to help you manage your headaches and help make sure there is not another cause of the headaches.  Using methods of stress relief, such as acupuncture, counseling, biofeedback, and massage. Talk with your health care provider about which methods might be good for you. Follow these instructions at home:  Take over-the-counter and prescription medicines only as told by your health care provider.  Stop the repeated use of pain medicine as told by your health care provider. Stopping can be difficult. Carefully follow instructions from your health care provider.  Avoid triggers that are known to cause your primary headaches.  Keep all follow-up visits as told by your health care provider. This is important. Contact a health care provider if:  You continue to experience headaches after following treatments that your health care provider recommended. Get help right away if:  You develop new headache pain.  You develop headache pain that is different than   what you have experienced in the past.  You develop numbness or tingling in your arms or legs.  You develop changes in your speech or vision. This information is not intended to replace advice given to you by your health care provider. Make sure you  discuss any questions you have with your health care provider. Document Released: 01/16/2004 Document Revised: 05/15/2016 Document Reviewed: 03/30/2016 Elsevier Interactive Patient Education  2017 Elsevier Inc.  

## 2016-11-14 NOTE — MAU Note (Signed)
Patient had cerclage placed on Wednesday, headache started Thursday night, unrelieved by Tylenol, making her nauseated, laying down makes it go away

## 2016-11-24 ENCOUNTER — Other Ambulatory Visit (HOSPITAL_COMMUNITY): Payer: Self-pay | Admitting: Obstetrics and Gynecology

## 2016-11-24 ENCOUNTER — Ambulatory Visit (HOSPITAL_COMMUNITY)
Admission: RE | Admit: 2016-11-24 | Discharge: 2016-11-24 | Disposition: A | Discharge status: Home / Self Care | Payer: Medicaid Other | Source: Ambulatory Visit | Attending: Obstetrics & Gynecology | Admitting: Obstetrics & Gynecology

## 2016-11-24 ENCOUNTER — Other Ambulatory Visit (HOSPITAL_COMMUNITY): Payer: Self-pay | Admitting: *Deleted

## 2016-11-24 ENCOUNTER — Encounter (HOSPITAL_COMMUNITY): Payer: Self-pay

## 2016-11-24 DIAGNOSIS — Z3A18 18 weeks gestation of pregnancy: Secondary | ICD-10-CM | POA: Insufficient documentation

## 2016-11-24 DIAGNOSIS — O09892 Supervision of other high risk pregnancies, second trimester: Secondary | ICD-10-CM

## 2016-11-24 DIAGNOSIS — O09212 Supervision of pregnancy with history of pre-term labor, second trimester: Secondary | ICD-10-CM | POA: Diagnosis not present

## 2016-11-24 DIAGNOSIS — O3432 Maternal care for cervical incompetence, second trimester: Secondary | ICD-10-CM | POA: Insufficient documentation

## 2016-12-01 ENCOUNTER — Encounter (HOSPITAL_COMMUNITY): Payer: Self-pay

## 2016-12-01 ENCOUNTER — Other Ambulatory Visit (HOSPITAL_COMMUNITY): Payer: Self-pay | Admitting: Obstetrics and Gynecology

## 2016-12-01 ENCOUNTER — Ambulatory Visit (HOSPITAL_COMMUNITY)
Admission: RE | Admit: 2016-12-01 | Discharge: 2016-12-01 | Disposition: A | Discharge status: Home / Self Care | Payer: Medicaid Other | Source: Ambulatory Visit | Attending: Obstetrics & Gynecology | Admitting: Obstetrics & Gynecology

## 2016-12-01 DIAGNOSIS — Z3A19 19 weeks gestation of pregnancy: Secondary | ICD-10-CM

## 2016-12-01 DIAGNOSIS — O09892 Supervision of other high risk pregnancies, second trimester: Secondary | ICD-10-CM

## 2016-12-01 DIAGNOSIS — O3432 Maternal care for cervical incompetence, second trimester: Secondary | ICD-10-CM

## 2016-12-01 DIAGNOSIS — O09212 Supervision of pregnancy with history of pre-term labor, second trimester: Secondary | ICD-10-CM | POA: Insufficient documentation

## 2016-12-01 DIAGNOSIS — Z363 Encounter for antenatal screening for malformations: Secondary | ICD-10-CM | POA: Insufficient documentation

## 2016-12-07 ENCOUNTER — Encounter (HOSPITAL_COMMUNITY): Payer: Self-pay

## 2016-12-08 ENCOUNTER — Ambulatory Visit (HOSPITAL_COMMUNITY)
Admission: RE | Admit: 2016-12-08 | Discharge: 2016-12-08 | Disposition: A | Discharge status: Home / Self Care | Payer: Medicaid Other | Source: Ambulatory Visit | Attending: Obstetrics & Gynecology | Admitting: Obstetrics & Gynecology

## 2016-12-08 ENCOUNTER — Encounter (HOSPITAL_COMMUNITY): Payer: Self-pay

## 2016-12-08 DIAGNOSIS — Z3A20 20 weeks gestation of pregnancy: Secondary | ICD-10-CM | POA: Insufficient documentation

## 2016-12-08 DIAGNOSIS — O3432 Maternal care for cervical incompetence, second trimester: Secondary | ICD-10-CM | POA: Insufficient documentation

## 2016-12-08 DIAGNOSIS — O09219 Supervision of pregnancy with history of pre-term labor, unspecified trimester: Secondary | ICD-10-CM | POA: Insufficient documentation

## 2016-12-08 DIAGNOSIS — Z9889 Other specified postprocedural states: Secondary | ICD-10-CM | POA: Insufficient documentation

## 2016-12-08 DIAGNOSIS — O3433 Maternal care for cervical incompetence, third trimester: Secondary | ICD-10-CM | POA: Insufficient documentation

## 2016-12-08 LAB — OB RESULTS CONSOLE GBS: STREP GROUP B AG: NEGATIVE

## 2016-12-09 LAB — OB RESULTS CONSOLE GC/CHLAMYDIA
Chlamydia: NEGATIVE
Gonorrhea: NEGATIVE

## 2016-12-15 ENCOUNTER — Encounter (HOSPITAL_COMMUNITY): Payer: Self-pay

## 2016-12-15 ENCOUNTER — Ambulatory Visit (HOSPITAL_COMMUNITY): Admission: RE | Admit: 2016-12-15 | Payer: Medicaid Other | Source: Ambulatory Visit

## 2016-12-15 DIAGNOSIS — O09893 Supervision of other high risk pregnancies, third trimester: Secondary | ICD-10-CM | POA: Insufficient documentation

## 2016-12-15 DIAGNOSIS — O26893 Other specified pregnancy related conditions, third trimester: Secondary | ICD-10-CM

## 2016-12-15 DIAGNOSIS — O09213 Supervision of pregnancy with history of pre-term labor, third trimester: Secondary | ICD-10-CM

## 2016-12-15 DIAGNOSIS — O0993 Supervision of high risk pregnancy, unspecified, third trimester: Secondary | ICD-10-CM

## 2016-12-15 DIAGNOSIS — Z6791 Unspecified blood type, Rh negative: Secondary | ICD-10-CM | POA: Insufficient documentation

## 2016-12-15 DIAGNOSIS — O26873 Cervical shortening, third trimester: Secondary | ICD-10-CM | POA: Diagnosis present

## 2016-12-21 ENCOUNTER — Encounter (HOSPITAL_COMMUNITY): Payer: Self-pay | Admitting: *Deleted

## 2016-12-21 ENCOUNTER — Inpatient Hospital Stay (HOSPITAL_COMMUNITY)
Admission: AD | Admit: 2016-12-21 | Discharge: 2016-12-21 | Disposition: A | Discharge status: Home / Self Care | Payer: Medicaid Other | Source: Ambulatory Visit | Attending: Obstetrics and Gynecology | Admitting: Obstetrics and Gynecology

## 2016-12-21 DIAGNOSIS — Z79899 Other long term (current) drug therapy: Secondary | ICD-10-CM | POA: Insufficient documentation

## 2016-12-21 DIAGNOSIS — I809 Phlebitis and thrombophlebitis of unspecified site: Secondary | ICD-10-CM | POA: Diagnosis not present

## 2016-12-21 DIAGNOSIS — O2222 Superficial thrombophlebitis in pregnancy, second trimester: Secondary | ICD-10-CM | POA: Insufficient documentation

## 2016-12-21 DIAGNOSIS — Z3A21 21 weeks gestation of pregnancy: Secondary | ICD-10-CM | POA: Diagnosis not present

## 2016-12-21 DIAGNOSIS — M79631 Pain in right forearm: Secondary | ICD-10-CM | POA: Diagnosis present

## 2016-12-21 MED ORDER — CEPHALEXIN 500 MG PO CAPS: 500.0000 mg | ORAL_CAPSULE | Freq: Two times a day (BID) | ORAL | 0 refills | Status: DC

## 2016-12-21 MED ORDER — CEPHALEXIN 500 MG PO CAPS: 500.0000 mg | ORAL_CAPSULE | Freq: Two times a day (BID) | ORAL | 0 refills | Status: AC

## 2016-12-21 NOTE — MAU Note (Addendum)
Was in [facility] 1/30-2/2  For a rescue cerclage IV was in rt hand, was removed on the 2nd.  A day or two later, it started hurting, area is very tender to touch.

## 2016-12-21 NOTE — MAU Note (Signed)
Urine in lab 

## 2016-12-21 NOTE — MAU Provider Note (Signed)
Patient Cassandra Russell CommentLeslie Shugrue is a 26 year old G2P0101 at 21 weeks and 6 days here with complaints of arm pain that started yesterday after she was discharged from the hospital.  History     CSN: [id number]  Arrival date and time: 12/21/16 1659   None     Chief Complaint  Patient presents with  . Arm Pain   Patient was discharged from the hospital    OB History    Gravida Para Term Preterm AB Living   2 1   1   1    SAB TAB Ectopic Multiple Live Births                  Past Medical History:  Diagnosis Date  . Chlamydia   . Conjunctivitis   . SVT (supraventricular tachycardia) (HCC) 10/2013   Adenosine was given in the ED ([facility]), pt was not admitted.    Past Surgical History:  Procedure Laterality Date  . CERVICAL CERCLAGE N/A 11/11/2016   Procedure: CERCLAGE CERVICAL;  Surgeon: Levi AlandMark E Anderson, MD;  Location: [facility];  Service: Gynecology;  Laterality: N/A;  . CLOSED REDUCTION FINGER WITH PERCUTANEOUS PINNING Left 01/24/2014   Procedure: LEFT SMALL FINGER CLOSED REDUCTION WITH PINNING/POSSIBLE ORIF;  Surgeon: Sharma CovertFred W Ortmann, MD;  Location: [facility];  Service: Orthopedics;  Laterality: Left;  . INTRAUTERINE DEVICE INSERTION  2009  . WISDOM TOOTH EXTRACTION      Family History  Problem Relation Age of Onset  . Hypertension Mother     Social History  Substance Use Topics  . Smoking status: Never Smoker  . Smokeless tobacco: Never Used  . Alcohol use No    Allergies: No Known Allergies  Prescriptions Prior to Admission  Medication Sig Dispense Refill Last Dose  . calcium carbonate (TUMS - DOSED IN MG ELEMENTAL CALCIUM) 500 MG chewable tablet Chew 2 tablets by mouth 2 (two) times daily as needed for indigestion or heartburn.   12/20/2016 at Unknown time  . hydroxyprogesterone caproate (MAKENA) 250 mg/mL OIL injection Inject 250 mg into the muscle once a week. Wednesdays   12/15/2016  . Prenatal Vit-Fe Fum-FA-Omega (ONE-A-DAY WOMENS PRENATAL PO) Take 1 tablet by mouth  daily.   12/20/2016 at Unknown time  . progesterone (PROMETRIUM) 200 MG capsule Place 200 mg vaginally at bedtime.   12/20/2016 at Unknown time    Review of Systems  Constitutional: Negative.   HENT: Negative.   Eyes: Negative.   Respiratory: Negative.   Cardiovascular: Negative.   Endocrine: Negative.   Genitourinary: Negative.   Musculoskeletal: Negative.   Skin: Positive for rash.  Hematological: Negative.    Physical Exam   Blood pressure 123/62, pulse 94, temperature 99.5 F (37.5 C), temperature source Oral, resp. rate 18, weight 150 lb 4 oz (68.2 kg), last menstrual period 07/21/2016.  Physical Exam  Constitutional: She appears well-developed.  HENT:  Head: Normocephalic.  Eyes: Pupils are equal, round, and reactive to light.  Neck: Normal range of motion.  Respiratory: Effort normal.  Musculoskeletal: Normal range of motion.  Neurological: She is alert.  Skin: Skin is warm and dry.  Former IV site on right forearm is erythematous and hot with red streaks going up the patient's arm to the [facility]. Tenderness to touch; decreased ROM in wrist and arm due to discomfort  -mild fever (99) but patient declines Tylenol  MAU Course  Procedures  MDM FHR dopplered at 134 bpm  Assessment and Plan   1. Phlebitis    2. DC  with Keflex RX phlebitis and recommendation to follow up with pcp if it becomes worse. Knows to return to [facility] ED if redness gets worse, fever increases or she starts feeling ill.    Charlesetta Garibaldi Kaven Cumbie CNM 12/21/2016, 5:53 PM

## 2016-12-21 NOTE — Discharge Instructions (Signed)
Phlebitis Phlebitis is soreness and puffiness (swelling) in a vein. Follow these instructions at home:  Only take medicine as told by your doctor.  Raise (elevate) the affected limb on a pillow as told by your doctor.  Keep a warm pack on the affected vein as told by your doctor. Do not sleep with a heating pad.  Use special stockings or bandages around the area of the affected vein as told by your doctor. These will speed healing and keep the condition from coming back.  Talk to your doctor about all the medicines you take.  Get follow-up blood tests as told by your doctor.  If the phlebitis is in your legs: ? Avoid standing or resting for long periods. ? Keep your legs moving. Raise your legs when you sit or lie.  Do not smoke.  Follow-up with your doctor as told. Contact a doctor if:  You have strange bruises or bleeding.  Your puffiness or pain in the affected area is not getting better.  You are taking medicine to lessen puffiness (anti-inflammatory medicine), and you get belly pain.  You have a fever. Get help right away if:  The phlebitis gets worse and you have more pain, puffiness (swelling), or redness.  You have trouble breathing or have chest pain. This information is not intended to replace advice given to you by your health care provider. Make sure you discuss any questions you have with your health care provider. Document Released: 10/14/2009 Document Revised: 04/02/2016 Document Reviewed: 07/03/2013 Elsevier Interactive Patient Education  2017 Elsevier Inc.  

## 2016-12-22 ENCOUNTER — Ambulatory Visit (HOSPITAL_COMMUNITY): Payer: Medicaid Other

## 2016-12-23 DIAGNOSIS — I8290 Acute embolism and thrombosis of unspecified vein: Secondary | ICD-10-CM | POA: Diagnosis present

## 2017-01-20 LAB — OB RESULTS CONSOLE RPR: RPR: NONREACTIVE

## 2017-01-20 LAB — OB RESULTS CONSOLE HIV ANTIBODY (ROUTINE TESTING): HIV: NONREACTIVE

## 2017-02-27 ENCOUNTER — Encounter (HOSPITAL_COMMUNITY): Payer: Self-pay | Admitting: *Deleted

## 2017-02-27 ENCOUNTER — Inpatient Hospital Stay (EMERGENCY_DEPARTMENT_HOSPITAL)
Admission: AD | Admit: 2017-02-27 | Discharge: 2017-02-27 | Disposition: A | Discharge status: Home / Self Care | Payer: Medicaid Other | Source: Ambulatory Visit | Attending: Obstetrics and Gynecology | Admitting: Obstetrics and Gynecology

## 2017-02-27 DIAGNOSIS — N898 Other specified noninflammatory disorders of vagina: Secondary | ICD-10-CM | POA: Diagnosis not present

## 2017-02-27 DIAGNOSIS — O26893 Other specified pregnancy related conditions, third trimester: Secondary | ICD-10-CM

## 2017-02-27 DIAGNOSIS — O4703 False labor before 37 completed weeks of gestation, third trimester: Secondary | ICD-10-CM

## 2017-02-27 DIAGNOSIS — O3433 Maternal care for cervical incompetence, third trimester: Secondary | ICD-10-CM

## 2017-02-27 LAB — URINALYSIS, ROUTINE W REFLEX MICROSCOPIC
Bilirubin Urine: NEGATIVE
GLUCOSE, UA: NEGATIVE mg/dL
Hgb urine dipstick: NEGATIVE
Ketones, ur: NEGATIVE mg/dL
LEUKOCYTES UA: NEGATIVE
Nitrite: NEGATIVE
PH: 7 (ref 5.0–8.0)
PROTEIN: NEGATIVE mg/dL
SPECIFIC GRAVITY, URINE: 1.018 (ref 1.005–1.030)

## 2017-02-27 LAB — AMNISURE RUPTURE OF MEMBRANE (ROM) NOT AT ARMC: AMNISURE: NEGATIVE

## 2017-02-27 NOTE — Discharge Instructions (Signed)
Preventing Preterm Birth °Preterm birth is when your baby is delivered between 20 weeks and 37 weeks of pregnancy. A full-term pregnancy lasts for at least 37 weeks. Preterm birth can be dangerous for your baby because the last few weeks of pregnancy are an important time for your baby's brain and lungs to grow. Many things can cause a baby to be born early. Sometimes the cause is not known. There are certain factors that make you more likely to experience preterm birth, such as: °· Having a previous baby born preterm. °· Being pregnant with twins or other multiples. °· Having had fertility treatment. °· Being overweight or underweight at the start of your pregnancy. °· Having any of the following during pregnancy: °¨ An infection, including a urinary tract infection (UTI) or an STI (sexually transmitted infection). °¨ High blood pressure. °¨ Diabetes. °¨ Vaginal bleeding. °· Being age 35 or older. °· Being age 18 or younger. °· Getting pregnant within 6 months of a previous pregnancy. °· Suffering extreme stress or physical or emotional abuse during pregnancy. °· Standing for long periods of time during pregnancy, such as working at a job that requires standing. °What are the risks? °The most serious risk of preterm birth is that the baby may not survive. This is more likely to happen if a baby is born before 34 weeks. Other risks and complications of preterm birth may include your baby having: °· Breathing problems. °· Brain damage that affects movement and coordination (cerebral palsy). °· Feeding difficulties. °· Vision or hearing problems. °· Infections or inflammation of the digestive tract (colitis). °· Developmental delays. °· Learning disabilities. °· Higher risk for diabetes, heart disease, and high blood pressure later in life. °What can I do to lower my risk? °Medical care °The most important thing you can do to lower your risk for preterm birth is to get routine medical care during pregnancy (prenatal  care). If you have a high risk of preterm birth, you may be referred to a health care provider who specializes in managing high-risk pregnancies (perinatologist). You may be given medicine to help prevent preterm birth. °Lifestyle changes °Certain lifestyle changes can also lower your risk of preterm birth: °· Wait at least 6 months after a pregnancy to become pregnant again. °· Try to plan pregnancy for when you are between 19 and 35 years old. °· Get to a healthy weight before getting pregnant. If you are overweight, work with your health care provider to safely lose weight. °· Do not use any products that contain nicotine or tobacco, such as cigarettes and e-cigarettes. If you need help quitting, ask your health care provider. °· Do not drink alcohol. °· Do not use drugs. °Where to find support: °For more support, consider: °· Talking with your health care provider. °· Talking with a therapist or substance abuse counselor, if you need help quitting. °· Working with a diet and nutrition specialist (dietitian) or a personal trainer to maintain a healthy weight. °· Joining a support group. °Where to find more information: °Learn more about preventing preterm birth from: °· Centers for Disease Control and Prevention: cdc.gov/reproductivehealth/maternalinfanthealth/pretermbirth.htm °· March of Dimes: marchofdimes.org/complications/premature-babies.aspx °· American Pregnancy Association: americanpregnancy.org/labor-and-birth/premature-labor °Contact a health care provider if: °· You have any of the following signs of preterm labor before 37 weeks: °¨ A change or increase in vaginal discharge. °¨ Fluid leaking from your vagina. °¨ Pressure or cramps in your lower abdomen. °¨ A backache that does not go away or gets worse. °¨   Regular tightening (contractions) in your lower abdomen. °Summary °· Preterm birth means having your baby during weeks 20-37 of pregnancy. °· Preterm birth may put your baby at risk for physical and  mental problems. °· Getting good prenatal care can help prevent preterm birth. °· You can lower your risk of preterm birth by making certain lifestyle changes, such as not smoking and not using alcohol. °This information is not intended to replace advice given to you by your health care provider. Make sure you discuss any questions you have with your health care provider. °Document Released: 12/10/2015 Document Revised: 07/04/2016 Document Reviewed: 07/04/2016 °Elsevier Interactive Patient Education © 2017 Elsevier Inc. ° °

## 2017-02-27 NOTE — MAU Provider Note (Signed)
History     CSN: [id number]  Arrival date and time: 02/27/17 1440   First Provider Initiated Contact with Patient 02/27/17 1538      No chief complaint on file.  HPI   Ms.Cassandra Russell is a 26 y.o. female G37P0101 @ [redacted]w[redacted]d here in MAU with vaginal spotting. She receiving Mckena.  She is currently receiving her prenatal care at [facility]. She started her care at [facility] and then transferred due to problems with shortened cervix.  On January 3rd, she had her first cerclage placed, and then in February she had a 2nd cerclage placed (20 weeks). There are 2 sutures in place.  She has been on pelvic rest and bedrest. She received steroids at 24 weeks.   She complains of vaginal bleeding that started today. It was just a streak of blood that she noticed mixed into her discharge. Her first baby was born at 46 weeks; he is well and healthy. She arrived to MAU with painless dilation to 4 cm followed by PTL with her fist baby. . She is concerned today due to everything she went through with him.  She wanted to make sure the bleeding is not a sign of preterm labor.  No recent intercourse. Some mild, lower abdominal cramps with some mild lower back pain.  + fetal movement.   Care everywhere overview note:  Patient had ultrasound indicated cerclage in [place] at 16 weeks At 20 weeks patient had revision of cerclage with Dr. Gavin Potters due to bulging membranes. Vaginal progesterone was added  S/p antenatal steroids 2/20 and 2/21 at 23 weeks  Patient OOW on decreased activity and pelvic rest    OB History    Gravida Para Term Preterm AB Living   SAB TAB Ectopic Multiple Live Births                  Past Medical History:  Diagnosis Date  . Chlamydia   . Conjunctivitis   . SVT (supraventricular tachycardia) (HCC) 10/2013   Adenosine was given in the ED ([facility]), pt was not admitted.    Past Surgical History:  Procedure Laterality  Date  . CERVICAL CERCLAGE N/A 11/11/2016   Procedure: CERCLAGE CERVICAL;  Surgeon: Levi Aland, MD;  Location: [facility];  Service: Gynecology;  Laterality: N/A;  . CLOSED REDUCTION FINGER WITH PERCUTANEOUS PINNING Left 01/24/2014   Procedure: LEFT SMALL FINGER CLOSED REDUCTION WITH PINNING/POSSIBLE ORIF;  Surgeon: Sharma Covert, MD;  Location: [facility] OR;  Service: Orthopedics;  Laterality: Left;  . INTRAUTERINE DEVICE INSERTION  2009  . WISDOM TOOTH EXTRACTION      Family History  Problem Relation Age of Onset  . Hypertension Mother     Social History  Substance Use Topics  . Smoking status: Never Smoker  . Smokeless tobacco: Never Used  . Alcohol use No    Allergies: No Known Allergies  Prescriptions Prior to Admission  Medication Sig Dispense Refill Last Dose  . calcium carbonate (TUMS - DOSED IN MG ELEMENTAL CALCIUM) 500 MG chewable tablet Chew 2 tablets by mouth 2 (two) times daily as needed for indigestion or heartburn.   02/26/2017 at Unknown time  . enoxaparin (LOVENOX) 80 MG/0.8ML injection Inject 80 mg into the skin 2 (two) times daily.   02/26/2017 at Unknown time  . Prenatal Vit-Fe Fum-FA-Omega (ONE-A-DAY WOMENS PRENATAL PO) Take 1 tablet by mouth daily.   Past Week at  Unknown time  . progesterone (PROMETRIUM) 200 MG capsule Place 200 mg vaginally at bedtime.   02/26/2017 at Unknown time  . hydroxyprogesterone caproate (MAKENA) 250 mg/mL OIL injection Inject 250 mg into the muscle once a week. Wednesdays   12/15/2016   Results for orders placed or performed during the hospital encounter of 02/27/17 (from the past 48 hour(s))  Urinalysis, Routine w reflex microscopic     Status: Abnormal   Collection Time: 02/27/17  2:56 PM  Result Value Ref Range   Color, Urine YELLOW YELLOW   APPearance HAZY (A) CLEAR   Specific Gravity, Urine 1.018 1.005 - 1.030   pH 7.0 5.0 - 8.0   Glucose, UA NEGATIVE NEGATIVE mg/dL   Hgb urine dipstick NEGATIVE NEGATIVE   Bilirubin Urine NEGATIVE  NEGATIVE   Ketones, ur NEGATIVE NEGATIVE mg/dL   Protein, ur NEGATIVE NEGATIVE mg/dL   Nitrite NEGATIVE NEGATIVE   Leukocytes, UA NEGATIVE NEGATIVE  Amnisure rupture of membrane (rom)not at [facility]     Status: None   Collection Time: 02/27/17  4:22 PM  Result Value Ref Range   Amnisure ROM NEGATIVE     Review of Systems  Gastrointestinal: Positive for abdominal pain (Mild cramping 2/10). Negative for nausea and vomiting.  Genitourinary: Positive for vaginal bleeding and vaginal discharge.   Physical Exam   Blood pressure 128/68, pulse (!) 106, temperature 98.7 F (37.1 C), resp. rate 18, weight 165 lb 4 oz (75 kg), last menstrual period 07/21/2016, SpO2 100 %.  Physical Exam  Constitutional: She is oriented to person, place, and time. She appears well-developed and well-nourished.  Non-toxic appearance. She does not have a sickly appearance. She does not appear ill. No distress.  HENT:  Head: Normocephalic.  Eyes: Pupils are equal, round, and reactive to light.  GI: Soft. She exhibits no distension. There is no tenderness. There is no rebound.  Genitourinary:  Genitourinary Comments: Vagina - Small-Moderate amount of mucoid/brown vaginal discharge, no odor, particles of vaginal progesterone noted. Some pooling.  Cervix - appears closed, no contact bleeding, no active bleeding  Bimanual exam: Cervix closed, sutures palpated  Chaperone present for exam.   Musculoskeletal: Normal range of motion.  Neurological: She is alert and oriented to person, place, and time.  Skin: Skin is warm. She is not diaphoretic.  Psychiatric: Her behavior is normal.   Fetal Tracing: Baseline: 135 bpm Variability: Moderate  Accelerations: 15x15 Decelerations: quick variable  Toco: occasional contraction    MAU Course  Procedures  None  MDM  Fern slide negative Amnisure negative  Patient rates her pain 2/10 at the time of discharge.   Assessment and Plan    A:  1. Vaginal discharge in  pregnancy in third trimester   2. Threatened preterm labor, third trimester   3. Cervical cerclage suture present in third trimester     P:  Discharge home in stable condition Follow up with OB as scheduled, or sooner if needed Strict return precautions Preterm labor precautions Pelvic rest Bleeding precautions.   Duane Lope, NP 02/27/2017 5:34 PM

## 2017-02-27 NOTE — MAU Note (Signed)
Went to [facility] this morning, noted bloody d/c after having BM. Little constipated, did strain some, hx of hemorrhoids.  Yesterday had loose stools.  abd pain, back pain.  Thinks it may have been vaginal

## 2017-02-28 ENCOUNTER — Inpatient Hospital Stay (HOSPITAL_COMMUNITY)
Admission: AD | Admit: 2017-02-28 | Discharge: 2017-03-02 | DRG: 778 | Disposition: A | Discharge status: Home / Self Care | Payer: Medicaid Other | Source: Ambulatory Visit | Attending: Obstetrics & Gynecology | Admitting: Obstetrics & Gynecology

## 2017-02-28 ENCOUNTER — Encounter (HOSPITAL_COMMUNITY): Payer: Self-pay

## 2017-02-28 DIAGNOSIS — O26873 Cervical shortening, third trimester: Secondary | ICD-10-CM | POA: Diagnosis present

## 2017-02-28 DIAGNOSIS — Z8249 Family history of ischemic heart disease and other diseases of the circulatory system: Secondary | ICD-10-CM

## 2017-02-28 DIAGNOSIS — O26893 Other specified pregnancy related conditions, third trimester: Secondary | ICD-10-CM | POA: Diagnosis present

## 2017-02-28 DIAGNOSIS — Z6791 Unspecified blood type, Rh negative: Secondary | ICD-10-CM

## 2017-02-28 DIAGNOSIS — N898 Other specified noninflammatory disorders of vagina: Secondary | ICD-10-CM | POA: Diagnosis not present

## 2017-02-28 DIAGNOSIS — I8290 Acute embolism and thrombosis of unspecified vein: Secondary | ICD-10-CM | POA: Diagnosis present

## 2017-02-28 DIAGNOSIS — Z3A31 31 weeks gestation of pregnancy: Secondary | ICD-10-CM

## 2017-02-28 DIAGNOSIS — Z86718 Personal history of other venous thrombosis and embolism: Secondary | ICD-10-CM

## 2017-02-28 DIAGNOSIS — O0993 Supervision of high risk pregnancy, unspecified, third trimester: Secondary | ICD-10-CM

## 2017-02-28 HISTORY — DX: Polycystic ovarian syndrome: E28.2

## 2017-02-28 HISTORY — DX: Deep phlebothrombosis in pregnancy, unspecified trimester: O22.30

## 2017-02-28 HISTORY — DX: Personal history of other infectious and parasitic diseases: Z86.19

## 2017-02-28 LAB — URINALYSIS, ROUTINE W REFLEX MICROSCOPIC
Bilirubin Urine: NEGATIVE
Glucose, UA: NEGATIVE mg/dL
Hgb urine dipstick: NEGATIVE
Ketones, ur: NEGATIVE mg/dL
LEUKOCYTES UA: NEGATIVE
NITRITE: NEGATIVE
PROTEIN: NEGATIVE mg/dL
SPECIFIC GRAVITY, URINE: 1.023 (ref 1.005–1.030)
pH: 6 (ref 5.0–8.0)

## 2017-02-28 LAB — CBC WITH DIFFERENTIAL/PLATELET
BASOS ABS: 0 10*3/uL (ref 0.0–0.1)
BASOS PCT: 0 %
Eosinophils Absolute: 0.3 10*3/uL (ref 0.0–0.7)
Eosinophils Relative: 2 %
HEMATOCRIT: 38.4 % (ref 36.0–46.0)
Hemoglobin: 12.7 g/dL (ref 12.0–15.0)
LYMPHS PCT: 17 %
Lymphs Abs: 2.5 10*3/uL (ref 0.7–4.0)
MCH: 30.2 pg (ref 26.0–34.0)
MCHC: 33.1 g/dL (ref 30.0–36.0)
MCV: 91.2 fL (ref 78.0–100.0)
Monocytes Absolute: 0.5 10*3/uL (ref 0.1–1.0)
Monocytes Relative: 3 %
NEUTROS ABS: 11.8 10*3/uL — AB (ref 1.7–7.7)
NEUTROS PCT: 78 %
PLATELETS: 260 10*3/uL (ref 150–400)
RBC: 4.21 MIL/uL (ref 3.87–5.11)
RDW: 13.4 % (ref 11.5–15.5)
WBC: 15.2 10*3/uL — AB (ref 4.0–10.5)

## 2017-02-28 LAB — AMNISURE RUPTURE OF MEMBRANE (ROM) NOT AT ARMC: Amnisure ROM: NEGATIVE

## 2017-02-28 MED ORDER — MAGNESIUM SULFATE 40 G IN LACTATED RINGERS - SIMPLE
2.0000 g/h | INTRAVENOUS | Status: DC
  Administered 2017-02-28: 2 g/h via INTRAVENOUS
  Filled 2017-02-28: qty 500

## 2017-02-28 MED ORDER — NIFEDIPINE 10 MG PO CAPS
10.0000 mg | ORAL_CAPSULE | ORAL | Status: AC | PRN
  Administered 2017-02-28 (×4): 10 mg via ORAL
  Filled 2017-02-28 (×4): qty 1

## 2017-02-28 MED ORDER — MAGNESIUM SULFATE BOLUS VIA INFUSION
6.0000 g | Freq: Once | INTRAVENOUS | Status: AC
  Administered 2017-02-28: 6 g via INTRAVENOUS
  Filled 2017-02-28: qty 500

## 2017-02-28 MED ORDER — ZOLPIDEM TARTRATE 5 MG PO TABS: 5.0000 mg | ORAL_TABLET | Freq: Every evening | ORAL | Status: DC | PRN

## 2017-02-28 MED ORDER — LACTATED RINGERS IV SOLN
INTRAVENOUS | Status: DC
  Administered 2017-02-28 – 2017-03-01 (×2): via INTRAVENOUS

## 2017-02-28 MED ORDER — CALCIUM CARBONATE ANTACID 500 MG PO CHEW: 2.0000 | CHEWABLE_TABLET | ORAL | Status: DC | PRN

## 2017-02-28 MED ORDER — ACETAMINOPHEN 325 MG PO TABS: 650.0000 mg | ORAL_TABLET | ORAL | Status: DC | PRN

## 2017-02-28 MED ORDER — LACTATED RINGERS IV BOLUS (SEPSIS)
1000.0000 mL | Freq: Once | INTRAVENOUS | Status: AC
  Administered 2017-02-28: 1000 mL via INTRAVENOUS

## 2017-02-28 MED ORDER — PROGESTERONE 200 MG VA SUPP
200.0000 mg | Freq: Every day | VAGINAL | Status: DC
  Administered 2017-02-28: 200 mg via VAGINAL
  Filled 2017-02-28 (×2): qty 1

## 2017-02-28 MED ORDER — DOCUSATE SODIUM 100 MG PO CAPS
100.0000 mg | ORAL_CAPSULE | Freq: Every day | ORAL | Status: DC
  Administered 2017-02-28 – 2017-03-02 (×3): 100 mg via ORAL
  Filled 2017-02-28 (×3): qty 1

## 2017-02-28 MED ORDER — PRENATAL MULTIVITAMIN CH
1.0000 | ORAL_TABLET | Freq: Every day | ORAL | Status: DC
  Administered 2017-03-01 – 2017-03-02 (×2): 1 via ORAL
  Filled 2017-02-28 (×2): qty 1

## 2017-02-28 MED ORDER — BETAMETHASONE SOD PHOS & ACET 6 (3-3) MG/ML IJ SUSP
12.0000 mg | Freq: Once | INTRAMUSCULAR | Status: AC
  Administered 2017-02-28: 12 mg via INTRAMUSCULAR
  Filled 2017-02-28: qty 2

## 2017-02-28 MED ORDER — BETAMETHASONE SOD PHOS & ACET 6 (3-3) MG/ML IJ SUSP
12.0000 mg | Freq: Once | INTRAMUSCULAR | Status: AC
  Administered 2017-03-01: 12 mg via INTRAMUSCULAR
  Filled 2017-02-28: qty 2

## 2017-02-28 NOTE — MAU Provider Note (Signed)
History     CSN: [id number]  Arrival date and time: 02/28/17 1523  First Provider Initiated Contact with Patient 02/28/17 1606   Chief Complaint  Patient presents with  . Contractions   HPI Cassandra Russell is a 26 y.o. G2P0101 at [redacted]w[redacted]d who presents with contractions. Patient was evaluated in MAU yesterday for r/o SROM, had negative amnisure & Crist Fat. Since last night has had painful contractions every 5 minutes. Rates pain 7/10. Denies vaginal bleeding or LOF but does not some tan discharge. Positive fetal movement.  Pt has hx of cervical insufficiency & has cervical cerclage. Had BMZ at 24 weeks. Care was transferred from [facility] ob/gyn to [facility] d/t high risk.   OB History    Gravida Para Term Preterm AB Living   SAB TAB Ectopic Multiple Live Births                  Past Medical History:  Diagnosis Date  . Chlamydia   . Conjunctivitis   . DVT (deep vein thrombosis) in pregnancy (HCC) 2018   arm  . Preterm labor   . SVT (supraventricular tachycardia) (HCC) 10/2013   Adenosine was given in the ED ([facility]), pt was not admitted.    Past Surgical History:  Procedure Laterality Date  . CERVICAL CERCLAGE N/A 11/11/2016   Procedure: CERCLAGE CERVICAL;  Surgeon: Levi Aland, MD;  Location: [facility];  Service: Gynecology;  Laterality: N/A;  . CLOSED REDUCTION FINGER WITH PERCUTANEOUS PINNING Left 01/24/2014   Procedure: LEFT SMALL FINGER CLOSED REDUCTION WITH PINNING/POSSIBLE ORIF;  Surgeon: Sharma Covert, MD;  Location: [facility];  Service: Orthopedics;  Laterality: Left;  . INTRAUTERINE DEVICE INSERTION  2009  . WISDOM TOOTH EXTRACTION      Family History  Problem Relation Age of Onset  . Hypertension Mother     Social History  Substance Use Topics  . Smoking status: Never Smoker  . Smokeless tobacco: Never Used  . Alcohol use No    Allergies: No Known Allergies  Prescriptions Prior to Admission  Medication Sig Dispense Refill Last Dose   . calcium carbonate (TUMS - DOSED IN MG ELEMENTAL CALCIUM) 500 MG chewable tablet Chew 2 tablets by mouth 2 (two) times daily as needed for indigestion or heartburn.   02/26/2017 at Unknown time  . enoxaparin (LOVENOX) 80 MG/0.8ML injection Inject 80 mg into the skin 2 (two) times daily.   02/26/2017 at 2300  . erythromycin ophthalmic ointment Place 1 application into both eyes at bedtime.   02/26/2017 at Unknown time  . hydroxyprogesterone caproate (MAKENA) 250 mg/mL OIL injection Inject 250 mg into the muscle every Wednesday.    02/24/2017 at unknown  . Prenatal MV-Min-FA-Omega-3 (PRENATAL GUMMIES/DHA & FA) 0.4-32.5 MG CHEW Chew 2 each by mouth daily.   02/27/2017 at Unknown time  . progesterone (PROMETRIUM) 200 MG capsule Place 200 mg vaginally at bedtime.   02/26/2017 at Unknown time    Review of Systems  Constitutional: Negative.   Gastrointestinal: Positive for abdominal pain. Negative for nausea and vomiting.  Genitourinary: Positive for vaginal discharge. Negative for vaginal bleeding.   Physical Exam   Blood pressure 122/66, pulse (!) 106, temperature 97.8 F (36.6 C), temperature source Oral, resp. rate 18, height  (1.549 m), weight 165 lb (74.8 kg), last menstrual period 07/21/2016, SpO2 99 %.  Physical Exam  Nursing note and vitals reviewed. Constitutional: She is oriented to person,  place, and time. She appears well-developed and well-nourished. No distress.  HENT:  Head: Normocephalic and atraumatic.  Eyes: Conjunctivae are normal. Right eye exhibits no discharge. Left eye exhibits no discharge. No scleral icterus.  Neck: Normal range of motion.  Respiratory: Effort normal. No respiratory distress.  GI: Soft.  Genitourinary: Cervix exhibits no friability. No bleeding in the vagina. Vaginal discharge (+ pooling small amount of clear fluid) found.  Genitourinary Comments: Cerclage knot @ 11 o'clock position  Neurological: She is alert and oriented to person, place, and time.   Skin: Skin is warm and dry. She is not diaphoretic.  Psychiatric: She has a normal mood and affect. Her behavior is normal. Judgment and thought content normal.   Dilation: Closed Effacement (%): 90 Cervical Position: [place]: -3 Exam by:: Linda Biehn,np  MAU Course  Procedures Results for orders placed or performed during the hospital encounter of 02/28/17 (from the past 24 hour(s))  Urinalysis, Routine w reflex microscopic     Status: None   Collection Time: 02/28/17  3:40 PM  Result Value Ref Range   Color, Urine YELLOW YELLOW   APPearance CLEAR CLEAR   Specific Gravity, Urine 1.023 1.005 - 1.030   pH 6.0 5.0 - 8.0   Glucose, UA NEGATIVE NEGATIVE mg/dL   Hgb urine dipstick NEGATIVE NEGATIVE   Bilirubin Urine NEGATIVE NEGATIVE   Ketones, ur NEGATIVE NEGATIVE mg/dL   Protein, ur NEGATIVE NEGATIVE mg/dL   Nitrite NEGATIVE NEGATIVE   Leukocytes, UA NEGATIVE NEGATIVE  Amnisure rupture of membrane (rom)not at [facility]     Status: None   Collection Time: 02/28/17  4:35 PM  Result Value Ref Range   Amnisure ROM NEGATIVE     MDM Reactive fetal tracing + pooling, inconclusive fern, negative amnisure IV fluid bolus & procardia 10 mg Q20 min x 4 doses Patient continues to contract every 3-5 minutes & is tearful w/ctx BMZ given Dr. Macon Large to come evaluate patient in MAU  Assessment and Plan  A: Preterm labor, third trimester  P: Admit to birthing suites per Dr. Nena Alexander 02/28/2017, 6:17 PM

## 2017-02-28 NOTE — Progress Notes (Signed)
Will hold Lovenox for now given possibility of progressing in preterm labor and needing regional anesthesia.  Tereso Newcomer, MD

## 2017-02-28 NOTE — Anesthesia Preprocedure Evaluation (Addendum)
Anesthesia Evaluation  Patient identified by MRN, date of birth, ID band Patient awake    Reviewed: Allergy & Precautions, H&P , NPO status , Patient's Chart, lab work & pertinent test results  Airway Mallampati: III  TM Distance: >3 FB Neck ROM: Full    Dental no notable dental hx. (+) Teeth Intact   Pulmonary neg pulmonary ROS,    Pulmonary exam normal breath sounds clear to auscultation       Cardiovascular + Peripheral Vascular Disease  Normal cardiovascular exam Rhythm:Regular Rate:Normal  DVT Right cephalic vein post op rescue cerclage #2 at [facility]. Work up negative for hematologic cause of Thrombosis. Was on Prometrium.  Has been on Lovenox  bid since 12/2016. Last dose 2 days ago.   Neuro/Psych negative neurological ROS  negative psych ROS   GI/Hepatic negative GI ROS, Neg liver ROS,   Endo/Other  Obesity  Renal/GU negative Renal ROS     Musculoskeletal negative musculoskeletal ROS (+)   Abdominal (+) + obese,   Peds  Hematology Lovenox therapeutic doses since 12/2016. Last dose 2 days ago.   Anesthesia Other Findings  Hx of SVT.... 10/2013 Adenosine was given in the ED ([facility]), pt was not admitted.     Reproductive/Obstetrics (+) Pregnancy 31 weeks admitted in early labor. Hx/o incompetent cervix. Cervical cerclage x 2 this pregnancy                            Anesthesia Physical Anesthesia Plan  ASA: II  Anesthesia Plan: Epidural   Post-op Pain Management:    Induction:   Airway Management Planned: Natural Airway  Additional Equipment:   Intra-op Plan:   Post-operative Plan:   Informed Consent: I have reviewed the patients History and Physical, chart, labs and discussed the procedure including the risks, benefits and alternatives for the proposed anesthesia with the patient or authorized representative who has indicated his/her understanding and  acceptance.   Dental Advisory Given  Plan Discussed with: Anesthesiologist  Anesthesia Plan Comments: (Labs checked- platelets confirmed with RN in room. Fetal heart tracing, per RN, reported to be stable enough for sitting procedure. Discussed epidural, and patient consents to the procedure:  included risk of possible headache,backache, failed block, allergic reaction, and nerve injury. This patient was asked if she had any questions or concerns before the procedure started.)       Anesthesia Quick Evaluation

## 2017-02-28 NOTE — MAU Note (Signed)
Yesterday the pains were mild and like menstrual cramps, today they are intense and much stronger.  Has a cerclage. Gets weekly injections and take progesterone capsules at night. No leaking or bleeding.

## 2017-02-28 NOTE — Consult Note (Signed)
[facility]  [facility] 02/28/2017    8:44 PM  Neonatal Medicine Consultation         Cassandra Russell          MRN:  [id number]  I was called at the request of the patient's obstetrician (Dr. Macon Large) to speak to this patient due to potential preterm delivery as early as 31 weeks.  The patient's prenatal course includes shortened cervix ultimately treated with cerclage, preterm labor, right arm thrombus that led to daily treatment with Lovenox.  She is 31 5/7 weeks currently.  She presented to MAU today with uterine contractions and a tense cerclage.  She already got a betamethasone course when [redacted] weeks gestation.  She has been admitted to L&D, and is receiving treatment that includes magnesium sulfate and betamethasone.  The baby is a boy.  I reviewed expectations for a baby born at 31+ weeks, including survival, length of stay, morbidities such as respiratory distress, IVH, infection, feeding intolerance, retinopathy.  I described how we provide respiratory and feeding support.  Mom plans to breast feed, which I encouraged as best for the baby (with supplementations for needed calories).  I let mom know that the baby's outlook generally improves the longer she remains undelivered.  Of note, she delivered a 25-week boy in our hospital 9 years ago (she said the child is doing well at this time).  I spent 30 minutes reviewing the record, speaking to the patient, and entering appropriate documentation.  More than 50% of the time was spent face to face with patient.   _____________________ Electronically Signed By: Angelita Ingles, MD Neonatologist

## 2017-02-28 NOTE — H&P (Signed)
HPI Cassandra Russell is a 26 y.o. G2P0101 at [redacted]w[redacted]d who presents with contractions. Patient was evaluated in MAU yesterday for r/o SROM, had negative amnisure & fern. Since last night has had painful contractions every 5 minutes. Rates pain 7/10. Denies vaginal bleeding or LOF but does not some tan discharge. Positive fetal movement. Pt has hx of cervical insufficiency & has cervical cerclage. Had BMZ at 24 weeks. Care was transferred from [facility] ob/gyn to [facility] d/t high risk.   Patient Active Problem List (as per [facility] Notes) . Occlusive thrombus of the right forearm 12/23/2016  Overview Note:  02/03/2017 Anit-Xa 0.99 01/01/2017 Anti-Xa 1.09 which is therapeutic and will continue on 80 mg BID for 3 months and check monthly Anti-Xa levels per Dr. Vincenza Hews then probably prophylactic the remainder of pregnancy  12/30/2016 70 mg BID of Lovenox heparin Assay at [facility] 0.55 will increase to 80 mg BID per Dr. Otho Perl and recheck assay on Friday after 3 doses.  Patient seen at [facility] 12/21/2016 and placed on Keflex 500 mg BID with no improvement in her redness and swelling. 12/23/2016 Dopplers with occlusive thrombus of the right forearm-  Plan 3 months of anticoagulation per Dr. Vincenza Hews  Thrombophilia work up negative at [facility], prothrombin gene mutation negative 01/20/2017  . Rh negative state in antepartum period, third trimester 12/15/2016  Overview Note:  02/03/2017 Rhogam given  . H/O preterm delivery, currently pregnant, third trimester 12/15/2016  Overview Note:  G1- Delivered at 25 weeks baby doing well, painless dilation to 4 cm followed by PTL  Makena weekly 16-36 weeks  . Cervical shortening affecting pregnancy in third trimester 12/15/2016  Overview Note:  Patient had ultrasound indicated cerclage in [place] at 16 weeks At 20 weeks patient had revision of cerclage with Dr. Gavin Potters due to bulging membranes. Vaginal progesterone was added  S/p NICU consult 12/15/2016  S/p  antenatal steroids 2/20 and 2/21 at 23 weeks  Patient OOW on decreased activity and pelvic rest    OB History    Gravida Para Term Preterm AB Living   SAB TAB Ectopic Multiple Live Births           1     Past Medical History:  Diagnosis Date  . Chlamydia   . Conjunctivitis   . DVT (deep vein thrombosis) in pregnancy (HCC) 2018   arm  . Preterm labor   . SVT (supraventricular tachycardia) (HCC) 10/2013   Adenosine was given in the ED ([facility]), pt was not admitted.   Past Surgical History:  Procedure Laterality Date  . CERVICAL CERCLAGE N/A 11/11/2016   Procedure: CERCLAGE CERVICAL;  Surgeon: Levi Aland, MD;  Location: [facility];  Service: Gynecology;  Laterality: N/A;  . CLOSED REDUCTION FINGER WITH PERCUTANEOUS PINNING Left 01/24/2014   Procedure: LEFT SMALL FINGER CLOSED REDUCTION WITH PINNING/POSSIBLE ORIF;  Surgeon: Sharma Covert, MD;  Location: [facility] OR;  Service: Orthopedics;  Laterality: Left;  . INTRAUTERINE DEVICE INSERTION  2009  . WISDOM TOOTH EXTRACTION     Family History: family history includes Hypertension in her mother. Social History:  reports that she has never smoked. She has never used smokeless tobacco. She reports that she does not drink alcohol or use drugs.   Prenatal labs: Dating: EDD of 04/27/2017 based on 09/09/2016 [place] at 7.1 weeks Initial prenatal labs: Blood type: A neg, antibody-neg, HIV NR, RPR NR, HepB NR, hgb 11.0, plts 254K, gc/chl  neg/neg 28 wk: Hgb 11.7 , Plts 231K , HIV NR, RPR NR Genetic screening: declined  Anatomy US: normal in [place] and normal fetal echo 01/26/2017 Flu: 09/2016 per patient  Rhogam given 02/03/2017 1 hr GTT: 01/20/2017, passed 85 TDaP: 01/20/2017 Plans for delivery: vaginal anticipated Contraception: Plans mirena  ROS History Dilation: Closed Effacement (%): 80 Station: 0 Exam by:: dr Silvester Reierson (Cerclages noted to be under some tension from fetal head) Blood pressure (!) 121/59, pulse (!) 114,  temperature 97.8 F (36.6 C), temperature source Oral, resp. rate 18, height  (1.549 m), weight 165 lb (74.8 kg), last menstrual period 07/21/2016, SpO2 97 %. Exam Physical Exam  Constitutional: She is oriented to person, place, and time. She appears well-developed and well-nourished.  HENT:  Head: Normocephalic and atraumatic.  Eyes: Conjunctivae and EOM are normal. Pupils are equal, round, and reactive to light.  Neck: Normal range of motion. Neck supple.  Cardiovascular: Regular rhythm.   Tachycardic  Respiratory: Effort normal and breath sounds normal.  GI: Soft. There is no tenderness.  Musculoskeletal: Normal range of motion.  Neurological: She is alert and oriented to person, place, and time. She has normal reflexes.  Skin: Skin is warm and dry.  Psychiatric: She has a normal mood and affect. Her behavior is normal. Judgment and thought content normal.    Bedside Ultrasound: Cephalic presentation. Fetal Heart Rate A  Mode External [pt up to b/r] filed at 02/28/2017 1914  Baseline Rate (A) 145 bpm filed at 02/28/2017 1914  Variability 6-25 BPM filed at 02/28/2017 1914  Accelerations 15 x 15 filed at 02/28/2017 1914  Decelerations None filed at 02/28/2017 1914  Irregular contractions every 2-4 minutes.  Results Results for orders placed or performed during the hospital encounter of 02/28/17 (from the past 72 hour(s))  Urinalysis, Routine w reflex microscopic     Status: None   Collection Time: 02/28/17  3:40 PM  Result Value Ref Range   Color, Urine YELLOW YELLOW   APPearance CLEAR CLEAR   Specific Gravity, Urine 1.023 1.005 - 1.030   pH 6.0 5.0 - 8.0   Glucose, UA NEGATIVE NEGATIVE mg/dL   Hgb urine dipstick NEGATIVE NEGATIVE   Bilirubin Urine NEGATIVE NEGATIVE   Ketones, ur NEGATIVE NEGATIVE mg/dL   Protein, ur NEGATIVE NEGATIVE mg/dL   Nitrite NEGATIVE NEGATIVE   Leukocytes, UA NEGATIVE NEGATIVE  CBC with Differential     Status: Abnormal (Preliminary result)    Collection Time: 02/28/17  4:20 PM  Result Value Ref Range   WBC 15.2 (H) 4.0 - 10.5 K/uL   RBC 4.21 3.87 - 5.11 MIL/uL   Hemoglobin 12.7 12.0 - 15.0 g/dL   HCT 09.8 11.9 - 14.7 %   MCV 91.2 78.0 - 100.0 fL   MCH 30.2 26.0 - 34.0 pg   MCHC 33.1 30.0 - 36.0 g/dL   RDW 82.9 56.2 - 13.0 %   Platelets 260 150 - 400 K/uL   Other PENDING %   Neutrophils Relative % 78 %   Neutro Abs 11.8 (H) 1.7 - 7.7 K/uL   Lymphocytes Relative 17 %   Lymphs Abs 2.5 0.7 - 4.0 K/uL   Monocytes Relative 3 %   Monocytes Absolute 0.5 0.1 - 1.0 K/uL   Eosinophils Relative 2 %   Eosinophils Absolute 0.3 0.0 - 0.7 K/uL   Basophils Relative 0 %   Basophils Absolute 0.0 0.0 - 0.1 K/uL  Amnisure rupture of membrane (rom)not at [facility]     Status:  None   Collection Time: 02/28/17  4:35 PM  Result Value Ref Range   Amnisure ROM NEGATIVE     Assessment/Plan: Preterm labor - Will start magnesium sulfate 6g bolus/2g per hour for tocolysis and CP prophylaxis - Continue betamethasone regimen - Reassuring fetal status - If PTL continues, will remove cerclages - Continue close observation   Wyvonnia Dusky 02/28/2017, 6:56 PM   Attestation of Attending Supervision of Advanced Practice Provider (PA/CNM/NP): Evaluation and management procedures were performed by the Advanced Practice Provider under my supervision and collaboration.  I have reviewed the Advanced Practice Provider's note and chart, and I agree with the management and plan.  I did contact Dr. Nitsche(MFM) and inform them of this patient's admission since she is currently their patient. Also talked to Dr. Katrinka Blazing (Neonatology); they will see her soon.  Continue inpatient observation on L&D for now.   Jaynie Collins, MD, FACOG Attending Obstetrician & Gynecologist Faculty Practice, [facility]

## 2017-03-01 DIAGNOSIS — Z3A31 31 weeks gestation of pregnancy: Secondary | ICD-10-CM

## 2017-03-01 LAB — GC/CHLAMYDIA PROBE AMP (~~LOC~~) NOT AT ARMC
Chlamydia: NEGATIVE
NEISSERIA GONORRHEA: NEGATIVE

## 2017-03-01 LAB — RPR: RPR: NONREACTIVE

## 2017-03-01 MED ORDER — PROGESTERONE MICRONIZED 200 MG PO CAPS
200.0000 mg | ORAL_CAPSULE | Freq: Every day | ORAL | Status: DC
  Administered 2017-03-01: 200 mg via VAGINAL
  Filled 2017-03-01: qty 1

## 2017-03-01 MED ORDER — MAGNESIUM SULFATE 40 G IN LACTATED RINGERS - SIMPLE
2.0000 g/h | INTRAVENOUS | Status: DC
  Administered 2017-03-01: 2 g/h via INTRAVENOUS
  Filled 2017-03-01: qty 500

## 2017-03-01 MED ORDER — HYDROXYPROGESTERONE CAPROATE 250 MG/ML IM OIL: 250.0000 mg | TOPICAL_OIL | INTRAMUSCULAR | Status: DC

## 2017-03-01 MED ORDER — ENOXAPARIN SODIUM 80 MG/0.8ML ~~LOC~~ SOLN
80.0000 mg | Freq: Two times a day (BID) | SUBCUTANEOUS | Status: DC
  Administered 2017-03-01 – 2017-03-02 (×3): 80 mg via SUBCUTANEOUS
  Filled 2017-03-01 (×3): qty 0.8

## 2017-03-01 NOTE — Progress Notes (Addendum)
ANTEPARTUM PROGRESS NOTE  Cassandra Russell is a 26 y.o. G2P0101 at [redacted]w[redacted]d  who is admitted for Preterm labor.   Fetal presentation is cephalic. Length of Stay:  1  Days  Subjective:  Patient was admitted for pre-term labor. She was started on magnesium with cessation of contractions. She has cerclage in place. She is comfrotable at this time with no bleeding or leaking of fluid.   Patient reports good fetal movement.  She reports no uterine contractions, no bleeding and no loss of fluid per vagina.  Vitals:  Blood pressure (!) 115/58, pulse 86, temperature 97.7 F (36.5 C), temperature source Oral, resp. rate 18, height  (1.549 m), weight 165 lb (74.8 kg), last menstrual period 07/21/2016, SpO2 98 %. Physical Examination: Gen: Well appearing NAD Abd: Soft NT, ND Extremities: extremities normal, atraumatic, no cyanosis or edema with DTRs 2+ on the bilateral Membranes:intact  Fetal Monitoring:  Baseline: 130 bpm  Labs:  Results for orders placed or performed during the hospital encounter of 02/28/17 (from the past 24 hour(s))  Urinalysis, Routine w reflex microscopic   Collection Time: 02/28/17  3:40 PM  Result Value Ref Range   Color, Urine YELLOW YELLOW   APPearance CLEAR CLEAR   Specific Gravity, Urine 1.023 1.005 - 1.030   pH 6.0 5.0 - 8.0   Glucose, UA NEGATIVE NEGATIVE mg/dL   Hgb urine dipstick NEGATIVE NEGATIVE   Bilirubin Urine NEGATIVE NEGATIVE   Ketones, ur NEGATIVE NEGATIVE mg/dL   Protein, ur NEGATIVE NEGATIVE mg/dL   Nitrite NEGATIVE NEGATIVE   Leukocytes, UA NEGATIVE NEGATIVE  CBC with Differential   Collection Time: 02/28/17  4:20 PM  Result Value Ref Range   WBC 15.2 (H) 4.0 - 10.5 K/uL   RBC 4.21 3.87 - 5.11 MIL/uL   Hemoglobin 12.7 12.0 - 15.0 g/dL   HCT 16.1 09.6 - 04.5 %   MCV 91.2 78.0 - 100.0 fL   MCH 30.2 26.0 - 34.0 pg   MCHC 33.1 30.0 - 36.0 g/dL   RDW 40.9 81.1 - 91.4 %   Platelets 260 150 - 400 K/uL   Neutrophils Relative % 78 %   Neutro Abs 11.8 (H) 1.7 - 7.7 K/uL   Lymphocytes Relative 17 %   Lymphs Abs 2.5 0.7 - 4.0 K/uL   Monocytes Relative 3 %   Monocytes Absolute 0.5 0.1 - 1.0 K/uL   Eosinophils Relative 2 %   Eosinophils Absolute 0.3 0.0 - 0.7 K/uL   Basophils Relative 0 %   Basophils Absolute 0.0 0.0 - 0.1 K/uL  RPR   Collection Time: 02/28/17  4:20 PM  Result Value Ref Range   RPR Ser Ql Non Reactive Non Reactive  Type and screen [facility] [place]   Collection Time: 02/28/17  4:20 PM  Result Value Ref Range   ABO/RH(D) A NEG    Antibody Screen POS    Sample Expiration 03/03/2017    Antibody Identification PASSIVELY ACQUIRED ANTI-D    DAT, IgG NEG    Unit Number [id number]    Blood Component Type RED CELLS,LR    Unit division 00    Status of Unit ALLOCATED    Transfusion Status OK TO TRANSFUSE    Crossmatch Result COMPATIBLE    Unit Number [id number]    Blood Component Type RED CELLS,LR    Unit division 00    Status of Unit ALLOCATED    Transfusion Status OK TO TRANSFUSE    Crossmatch Result COMPATIBLE   BPAM  RBC   Collection Time: 02/28/17  4:20 PM  Result Value Ref Range   Blood Product Unit Number [id number]    Unit Type and Rh 0600    Blood Product Expiration Date [id number]    Blood Product Unit Number [id number]    Unit Type and Rh 0600    Blood Product Expiration Date [id number]   Amnisure rupture of membrane (rom)not at [facility]   Collection Time: 02/28/17  4:35 PM  Result Value Ref Range   Amnisure ROM NEGATIVE     Imaging Studies:    none   Medications:  Scheduled . betamethasone acetate-betamethasone sodium phosphate  12 mg Intramuscular Once  . docusate sodium  100 mg Oral Daily  . prenatal multivitamin  1 tablet Oral Q1200  . progesterone  200 mg Vaginal QHS     ASSESSMENT: Patient Active Problem List   Diagnosis Date Noted  . Preterm labor 02/28/2017  . Occlusive thrombus 12/23/2016  . 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 H/O preterm delivery, currently pregnant, third trimester 12/15/2016  . Rh negative state in antepartum period, third trimester 12/15/2016  . Supervision of high risk pregnancy in third trimester 12/15/2016  . Cervical cerclage suture present in third trimester 12/08/2016  . History of cervical cerclage 12/08/2016  . History of supraventricular tachycardia 09/24/2015  . Dysplasia of cervix, low grade (CIN 1) 08/11/2012    PLAN: Patient seems to have had PTL aborted. Continue magnesium until 2nd betamethasone Resume prometrium and makena Observe overnight and if no contraction resume after magnesium D/C will D/C home in AM.  History of DVT on lovenox - resume today.  Continue routine antenatal care.   Ernestina Penna 03/01/2017,10:49 AM

## 2017-03-01 NOTE — Anesthesia Pain Management Evaluation Note (Signed)
  CRNA Pain Management Visit Note  Patient: Cassandra Russell, 26 y.o., female  "Hello I am a member of the anesthesia team at [facility]. We have an anesthesia team available at all times to provide care throughout the hospital, including epidural management and anesthesia for C-section. I don't know your plan for the delivery whether it a natural birth, water birth, IV sedation, nitrous supplementation, doula or epidural, but we want to meet your pain goals."   1.Was your pain managed to your expectations on prior hospitalizations?   No prior hospitalizations  2.What is your expectation for pain management during this hospitalization?     Epidural  3.How can we help you reach that goal? Epidural when ready  Record the patient's initial score and the patient's pain goal.   Pain: 0  Pain Goal: 8 [facility] wants you to be able to say your pain was always managed very well.  Edison Pace 03/01/2017

## 2017-03-01 NOTE — Progress Notes (Signed)
Dr. Debroah Loop notified pt had prolonged decel to 100's back up to baseline reactive strip will continue to monitor no new orders

## 2017-03-02 NOTE — Progress Notes (Signed)
Discharge instructions given to patient, questions answered, pt states understanding, signed and given copy

## 2017-03-02 NOTE — Discharge Summary (Signed)
Physician Discharge Summary  Patient ID: Cassandra Russell MRN: [id number] DOB/AGE: 1991-01-08 26 y.o.  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 Patient was evaluated in MAU yesterday for r/o SROM, had negative amnisure &fern. Since last night has had painful contractions every 5 minutes. Rates pain 7/10. Denies vaginal bleeding or LOF but does not some tan discharge. Positive fetal movement. Pt has hx of cervical insufficiency &has cervical cerclage. Had BMZ at 24 weeks. Care was transferred from [facility] to [facility] d/t high risk.   Patient Active Problem List (as per [facility] Notes) . Occlusive thrombus of the right forearm 12/23/2016  Overview Note:  02/03/2017 Anit-Xa 0.99 01/01/2017 Anti-Xa 1.09 which is therapeutic and will continue on 80 mg BID for 3 months and check monthly Anti-Xa levels per Dr. Vincenza Hews then probably prophylactic the remainder of pregnancy  12/30/2016 70 mg BID of Lovenox heparin Assay at [facility] 0.55 will increase to 80 mg BID per Dr. Otho Perl and recheck assay on Friday after 3 doses.  Patient seen at [facility] 12/21/2016 and placed on Keflex 500 mg BID with no improvement in her redness and swelling. 12/23/2016 Dopplers with occlusive thrombus of the right forearm-  Plan 3 months of anticoagulation per Dr. Vincenza Hews  Thrombophilia work up negative at [facility], prothrombin gene mutation negative 01/20/2017  . Rh negative state in antepartum period, third trimester 12/15/2016  Overview Note:  02/03/2017 Rhogam given  . H/O preterm delivery, currently  pregnant, third trimester 12/15/2016  Overview Note:  G1- Delivered at 25 weeks baby doing well, painless dilation to 4 cm followed by PTL  Makena weekly 16-36 weeks  . 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 Gavin Potters due to bulging membranes. Vaginal progesterone was added  S/p NICU consult 12/15/2016  S/p antenatal steroids 2/20 and 2/21 at 23 weeks  Patient OOW on decreased activity and pelvic rest            OB History    Gravida Para Term Preterm AB Living   SAB TAB Ectopic Multiple Live Births           1         Past Medical History:  Diagnosis Date  . Chlamydia   . Conjunctivitis   . DVT (deep vein thrombosis) in pregnancy (HCC) 2018   arm  . Preterm labor   . SVT (supraventricular tachycardia) (HCC) 10/2013   Adenosine was given in the ED ([facility]), pt was not admitted.        Past Surgical History:  Procedure Laterality Date  . CERVICAL CERCLAGE N/A 11/11/2016   Procedure: CERCLAGE CERVICAL;  Surgeon: Levi Aland, MD;  Location: [facility] ORS;  Service: Gynecology;  Laterality: N/A;  . CLOSED REDUCTION FINGER WITH PERCUTANEOUS PINNING Left 01/24/2014   Procedure: LEFT SMALL FINGER CLOSED REDUCTION WITH PINNING/POSSIBLE ORIF;  Surgeon: Sharma Covert, MD;  Location: [facility] OR;  Service: Orthopedics;  Laterality: Left;  .  INTRAUTERINE DEVICE INSERTION  2009  . WISDOM TOOTH EXTRACTION     Family History: family history includes Hypertension in her mother. Social History:  reports that she has never smoked. She has never used smokeless tobacco. She reports that she does not drink alcohol or use drugs.   Prenatal labs: Dating: EDD of 04/27/2017 based on 09/09/2016 [place] at 7.1 weeks Initial prenatal labs: Blood type: A neg, antibody-neg, HIV NR, RPR NR, HepB NR, hgb 11.0, plts 254K,  gc/chl neg/neg 28 wk: Hgb 11.7 , Plts 231K , HIV NR, RPR NR Genetic screening: declined  Anatomy US: normal in [place] and normal fetal echo 01/26/2017 Flu: 09/2016 per patient  Rhogam given 02/03/2017 1 hr GTT: 01/20/2017, passed 85 TDaP: 01/20/2017 Plans for delivery: vaginal anticipated Contraception: Plans mirena  ROS History Dilation: Closed Effacement (%): 80 Station: 0 Exam by:: dr [email or web address] (Cerclages noted to be under some tension from fetal head) Blood pressure (!) 121/59, pulse (!) 114, temperature 97.8 F (36.6 C), temperature source Oral, resp. rate 18, height  (1.549 m), weight 165 lb (74.8 kg), last menstrual period 07/21/2016, SpO2 97 %. Exam Physical Exam  Constitutional: She is oriented to person, place, and time. She appears well-developed and well-nourished.  HENT:  Head: Normocephalic and atraumatic.  Eyes: Conjunctivae and EOM are normal. Pupils are equal, round, and reactive to light.  Neck: Normal range of motion. Neck supple.  Cardiovascular: Regular rhythm.   Tachycardic  Respiratory: Effort normal and breath sounds normal.  GI: Soft. There is no tenderness.  Musculoskeletal: Normal range of motion.  Neurological: She is alert and oriented to person, place, and time. She has normal reflexes.  Skin: Skin is warm and dry.  Psychiatric: She has a normal mood and affect. Her behavior is normal. Judgment and thought content normal.    Bedside Ultrasound: Cephalic presentation.    Fetal Heart Rate A  Mode External [pt up to b/r] filed at 02/28/2017 1914  Baseline Rate (A) 145 bpm filed at 02/28/2017 1914  Variability 6-25 BPM filed at 02/28/2017 1914  Accelerations 15 x 15 filed at 02/28/2017 1914  Decelerations None filed at 02/28/2017 1914  Irregular contractions every 2-4 minutes.  Results LabResultsPast72Hours       Results for orders placed or performed during the hospital encounter of 02/28/17 (from the past 72 hour(s))  Urinalysis,  Routine w reflex microscopic     Status: None   Collection Time: 02/28/17  3:40 PM  Result Value Ref Range   Color, Urine YELLOW YELLOW   APPearance CLEAR CLEAR   Specific Gravity, Urine 1.023 1.005 - 1.030   pH 6.0 5.0 - 8.0   Glucose, UA NEGATIVE NEGATIVE mg/dL   Hgb urine dipstick NEGATIVE NEGATIVE   Bilirubin Urine NEGATIVE NEGATIVE   Ketones, ur NEGATIVE NEGATIVE mg/dL   Protein, ur NEGATIVE NEGATIVE mg/dL   Nitrite NEGATIVE NEGATIVE   Leukocytes, UA NEGATIVE NEGATIVE  CBC with Differential     Status: Abnormal (Preliminary result)   Collection Time: 02/28/17  4:20 PM  Result Value Ref Range   WBC 15.2 (H) 4.0 - 10.5 K/uL   RBC 4.21 3.87 - 5.11 MIL/uL   Hemoglobin 12.7 12.0 - 15.0 g/dL   HCT 16.1 09.6 - 04.5 %   MCV 91.2 78.0 - 100.0 fL   MCH 30.2 26.0 - 34.0 pg   MCHC 33.1 30.0 - 36.0 g/dL   RDW 40.9 81.1 - 91.4 %   Platelets 260 150 - 400 K/uL  Other PENDING %   Neutrophils Relative % 78 %   Neutro Abs 11.8 (H) 1.7 - 7.7 K/uL   Lymphocytes Relative 17 %   Lymphs Abs 2.5 0.7 - 4.0 K/uL   Monocytes Relative 3 %   Monocytes Absolute 0.5 0.1 - 1.0 K/uL   Eosinophils Relative 2 %   Eosinophils Absolute 0.3 0.0 - 0.7 K/uL   Basophils Relative 0 %   Basophils Absolute 0.0 0.0 - 0.1 K/uL  Amnisure rupture of membrane (rom)not at [facility]     Status: None   Collection Time: 02/28/17  4:35 PM  Result Value Ref Range   Amnisure ROM NEGATIVE       Assessment/Plan: Preterm labor - Will start magnesium sulfate 6g bolus/2g per hour for tocolysis and CP prophylaxis - Continue betamethasone regimen - Reassuring fetal status - If PTL continues, will remove cerclages - Continue close observation   Magnesium was discontinued after 24 hrs and she did not resume contractions and she was discharged stable  Consults: None  Significant Diagnostic Studies: labs:  CBC    Component Value Date/Time   WBC 15.2 (H) 02/28/2017 1620   RBC 4.21  02/28/2017 1620   HGB 12.7 02/28/2017 1620   HCT 38.4 02/28/2017 1620   PLT 260 02/28/2017 1620   MCV 91.2 02/28/2017 1620   MCH 30.2 02/28/2017 1620   MCHC 33.1 02/28/2017 1620   RDW 13.4 02/28/2017 1620   LYMPHSABS 2.5 02/28/2017 1620   MONOABS 0.5 02/28/2017 1620   EOSABS 0.3 02/28/2017 1620   BASOSABS 0.0 02/28/2017 1620     Treatments: magnesium sulfate and betamethasone course  Discharge Exam: Blood pressure (!) 115/51, pulse 90, temperature 98.1 F (36.7 C), temperature source Oral, resp. rate 18, height  (1.549 m), weight 74.8 kg (165 lb), last menstrual period 07/21/2016, SpO2 99 %. General appearance: alert, cooperative and no distress gravid and not tender  Disposition: 01-Home or Self Care   Allergies as of 03/02/2017   No Known Allergies     Medication List    TAKE these medications   calcium carbonate 500 MG chewable tablet Commonly known as:  TUMS - dosed in mg elemental calcium Chew 2 tablets by mouth 2 (two) times daily as needed for indigestion or heartburn.   enoxaparin 80 MG/0.8ML injection Commonly known as:  LOVENOX Inject 80 mg into the skin 2 (two) times daily.   hydroxyprogesterone caproate 250 mg/mL Oil injection Commonly known as:  MAKENA Inject 250 mg into the muscle every Wednesday.   PRENATAL GUMMIES/DHA & FA 0.4-32.5 MG Chew Chew 2 each by mouth daily.   progesterone 200 MG capsule Commonly known as:  PROMETRIUM Place 200 mg vaginally at bedtime.      [facility] MFM in 2 days  Signed: Scheryl Darter 03/02/2017, 7:27 AM

## 2017-03-03 LAB — CULTURE, BETA STREP (GROUP B ONLY)

## 2017-03-04 ENCOUNTER — Encounter (HOSPITAL_COMMUNITY): Payer: Self-pay

## 2017-03-04 ENCOUNTER — Inpatient Hospital Stay (HOSPITAL_COMMUNITY): Payer: Medicaid Other | Admitting: Anesthesiology

## 2017-03-04 ENCOUNTER — Inpatient Hospital Stay (HOSPITAL_COMMUNITY)
Admission: AD | Admit: 2017-03-04 | Discharge: 2017-03-07 | DRG: 775 | Disposition: A | Discharge status: Home / Self Care | Payer: Medicaid Other | Source: Ambulatory Visit | Attending: Obstetrics & Gynecology | Admitting: Obstetrics & Gynecology

## 2017-03-04 DIAGNOSIS — O26893 Other specified pregnancy related conditions, third trimester: Secondary | ICD-10-CM | POA: Diagnosis present

## 2017-03-04 DIAGNOSIS — I8290 Acute embolism and thrombosis of unspecified vein: Secondary | ICD-10-CM

## 2017-03-04 DIAGNOSIS — O42013 Preterm premature rupture of membranes, onset of labor within 24 hours of rupture, third trimester: Secondary | ICD-10-CM | POA: Diagnosis present

## 2017-03-04 DIAGNOSIS — Z3A32 32 weeks gestation of pregnancy: Secondary | ICD-10-CM | POA: Diagnosis not present

## 2017-03-04 DIAGNOSIS — Z86718 Personal history of other venous thrombosis and embolism: Secondary | ICD-10-CM

## 2017-03-04 DIAGNOSIS — E669 Obesity, unspecified: Secondary | ICD-10-CM | POA: Diagnosis present

## 2017-03-04 DIAGNOSIS — Z7901 Long term (current) use of anticoagulants: Secondary | ICD-10-CM | POA: Diagnosis not present

## 2017-03-04 DIAGNOSIS — O0993 Supervision of high risk pregnancy, unspecified, third trimester: Secondary | ICD-10-CM

## 2017-03-04 DIAGNOSIS — Z8249 Family history of ischemic heart disease and other diseases of the circulatory system: Secondary | ICD-10-CM | POA: Diagnosis not present

## 2017-03-04 DIAGNOSIS — O26873 Cervical shortening, third trimester: Secondary | ICD-10-CM | POA: Diagnosis present

## 2017-03-04 DIAGNOSIS — O42919 Preterm premature rupture of membranes, unspecified as to length of time between rupture and onset of labor, unspecified trimester: Secondary | ICD-10-CM | POA: Diagnosis present

## 2017-03-04 DIAGNOSIS — O99214 Obesity complicating childbirth: Secondary | ICD-10-CM | POA: Diagnosis present

## 2017-03-04 DIAGNOSIS — Z6831 Body mass index (BMI) 31.0-31.9, adult: Secondary | ICD-10-CM

## 2017-03-04 DIAGNOSIS — Z6791 Unspecified blood type, Rh negative: Secondary | ICD-10-CM

## 2017-03-04 DIAGNOSIS — O09893 Supervision of other high risk pregnancies, third trimester: Secondary | ICD-10-CM

## 2017-03-04 DIAGNOSIS — O3433 Maternal care for cervical incompetence, third trimester: Secondary | ICD-10-CM | POA: Diagnosis present

## 2017-03-04 DIAGNOSIS — O09213 Supervision of pregnancy with history of pre-term labor, third trimester: Secondary | ICD-10-CM

## 2017-03-04 DIAGNOSIS — Z8679 Personal history of other diseases of the circulatory system: Secondary | ICD-10-CM

## 2017-03-04 LAB — AMNISURE RUPTURE OF MEMBRANE (ROM) NOT AT ARMC: Amnisure ROM: POSITIVE

## 2017-03-04 LAB — BPAM RBC
BLOOD PRODUCT EXPIRATION DATE: 201805142359
BLOOD PRODUCT EXPIRATION DATE: 201805142359
UNIT TYPE AND RH: 600
UNIT TYPE AND RH: 600

## 2017-03-04 LAB — TYPE AND SCREEN
ABO/RH(D): A NEG
ANTIBODY SCREEN: POSITIVE
DAT, IGG: NEGATIVE
UNIT DIVISION: 0
Unit division: 0

## 2017-03-04 LAB — CBC
HEMATOCRIT: 37.9 % (ref 36.0–46.0)
HEMOGLOBIN: 12.8 g/dL (ref 12.0–15.0)
MCH: 30.5 pg (ref 26.0–34.0)
MCHC: 33.8 g/dL (ref 30.0–36.0)
MCV: 90.5 fL (ref 78.0–100.0)
Platelets: 274 10*3/uL (ref 150–400)
RBC: 4.19 MIL/uL (ref 3.87–5.11)
RDW: 13.2 % (ref 11.5–15.5)
WBC: 15.7 10*3/uL — ABNORMAL HIGH (ref 4.0–10.5)

## 2017-03-04 LAB — URINALYSIS, ROUTINE W REFLEX MICROSCOPIC
BILIRUBIN URINE: NEGATIVE
Glucose, UA: NEGATIVE mg/dL
Hgb urine dipstick: NEGATIVE
Ketones, ur: NEGATIVE mg/dL
Leukocytes, UA: NEGATIVE
NITRITE: NEGATIVE
Protein, ur: NEGATIVE mg/dL
SPECIFIC GRAVITY, URINE: 1.016 (ref 1.005–1.030)
pH: 6 (ref 5.0–8.0)

## 2017-03-04 LAB — RPR: RPR Ser Ql: NONREACTIVE

## 2017-03-04 MED ORDER — DOCUSATE SODIUM 100 MG PO CAPS: 100.0000 mg | ORAL_CAPSULE | Freq: Every day | ORAL | Status: DC

## 2017-03-04 MED ORDER — OXYCODONE-ACETAMINOPHEN 5-325 MG PO TABS: 1.0000 | ORAL_TABLET | ORAL | Status: DC | PRN

## 2017-03-04 MED ORDER — AMOXICILLIN 500 MG PO CAPS: 500.0000 mg | ORAL_CAPSULE | Freq: Three times a day (TID) | ORAL | Status: DC

## 2017-03-04 MED ORDER — SODIUM CHLORIDE 0.9 % IV SOLN
2.0000 g | Freq: Four times a day (QID) | INTRAVENOUS | Status: DC
  Administered 2017-03-04 (×3): 2 g via INTRAVENOUS
  Filled 2017-03-04 (×6): qty 2000

## 2017-03-04 MED ORDER — LIDOCAINE HCL (PF) 1 % IJ SOLN
30.0000 mL | INTRAMUSCULAR | Status: DC | PRN
  Filled 2017-03-04: qty 30

## 2017-03-04 MED ORDER — LACTATED RINGERS IV SOLN
INTRAVENOUS | Status: DC
  Administered 2017-03-04: 125 mL/h via INTRAVENOUS
  Administered 2017-03-04: 09:00:00 via INTRAVENOUS

## 2017-03-04 MED ORDER — PHENYLEPHRINE 40 MCG/ML (10ML) SYRINGE FOR IV PUSH (FOR BLOOD PRESSURE SUPPORT)
80.0000 ug | PREFILLED_SYRINGE | INTRAVENOUS | Status: DC | PRN
  Filled 2017-03-04: qty 5
  Filled 2017-03-04: qty 10

## 2017-03-04 MED ORDER — CALCIUM CARBONATE ANTACID 500 MG PO CHEW: 2.0000 | CHEWABLE_TABLET | ORAL | Status: DC | PRN

## 2017-03-04 MED ORDER — ZOLPIDEM TARTRATE 5 MG PO TABS: 5.0000 mg | ORAL_TABLET | Freq: Every evening | ORAL | Status: DC | PRN

## 2017-03-04 MED ORDER — LACTATED RINGERS IV SOLN
500.0000 mL | Freq: Once | INTRAVENOUS | Status: AC
  Administered 2017-03-04: 500 mL via INTRAVENOUS

## 2017-03-04 MED ORDER — ONDANSETRON HCL 4 MG/2ML IJ SOLN: 4.0000 mg | Freq: Four times a day (QID) | INTRAMUSCULAR | Status: DC | PRN

## 2017-03-04 MED ORDER — OXYTOCIN BOLUS FROM INFUSION: 500.0000 mL | Freq: Once | INTRAVENOUS | Status: DC

## 2017-03-04 MED ORDER — PRENATAL MULTIVITAMIN CH: 1.0000 | ORAL_TABLET | Freq: Every day | ORAL | Status: DC

## 2017-03-04 MED ORDER — PROGESTERONE MICRONIZED 200 MG PO CAPS
200.0000 mg | ORAL_CAPSULE | Freq: Every day | ORAL | Status: DC
  Administered 2017-03-04: 200 mg via ORAL
  Filled 2017-03-04: qty 1

## 2017-03-04 MED ORDER — ACETAMINOPHEN 325 MG PO TABS: 650.0000 mg | ORAL_TABLET | ORAL | Status: DC | PRN

## 2017-03-04 MED ORDER — EPHEDRINE 5 MG/ML INJ
10.0000 mg | INTRAVENOUS | Status: DC | PRN
  Filled 2017-03-04: qty 2

## 2017-03-04 MED ORDER — FENTANYL 2.5 MCG/ML BUPIVACAINE 1/10 % EPIDURAL INFUSION (WH - ANES)
14.0000 mL/h | INTRAMUSCULAR | Status: DC | PRN
  Administered 2017-03-04 (×2): 14 mL/h via EPIDURAL
  Filled 2017-03-04: qty 100

## 2017-03-04 MED ORDER — AZITHROMYCIN 250 MG PO TABS
500.0000 mg | ORAL_TABLET | Freq: Every day | ORAL | Status: DC
  Administered 2017-03-04: 500 mg via ORAL
  Filled 2017-03-04: qty 2

## 2017-03-04 MED ORDER — LACTATED RINGERS IV SOLN
500.0000 mL | INTRAVENOUS | Status: DC | PRN
  Administered 2017-03-04: 1000 mL via INTRAVENOUS
  Administered 2017-03-04: 500 mL via INTRAVENOUS

## 2017-03-04 MED ORDER — PHENYLEPHRINE 40 MCG/ML (10ML) SYRINGE FOR IV PUSH (FOR BLOOD PRESSURE SUPPORT)
80.0000 ug | PREFILLED_SYRINGE | INTRAVENOUS | Status: DC | PRN
  Administered 2017-03-04 (×2): 80 ug via INTRAVENOUS
  Filled 2017-03-04: qty 5

## 2017-03-04 MED ORDER — DIPHENHYDRAMINE HCL 50 MG/ML IJ SOLN: 12.5000 mg | INTRAMUSCULAR | Status: DC | PRN

## 2017-03-04 MED ORDER — FENTANYL CITRATE (PF) 100 MCG/2ML IJ SOLN
50.0000 ug | INTRAMUSCULAR | Status: DC | PRN
Start: 2017-03-04 — End: 2017-03-05
  Administered 2017-03-04 (×2): 100 ug via INTRAVENOUS
  Administered 2017-03-04: 50 ug via INTRAVENOUS
  Administered 2017-03-04: 100 ug via INTRAVENOUS
  Filled 2017-03-04 (×5): qty 2

## 2017-03-04 MED ORDER — OXYCODONE-ACETAMINOPHEN 5-325 MG PO TABS: 2.0000 | ORAL_TABLET | ORAL | Status: DC | PRN

## 2017-03-04 MED ORDER — FLEET ENEMA 7-19 GM/118ML RE ENEM: 1.0000 | ENEMA | Freq: Every day | RECTAL | Status: DC | PRN

## 2017-03-04 MED ORDER — OXYTOCIN 40 UNITS IN LACTATED RINGERS INFUSION - SIMPLE MED
2.5000 [IU]/h | INTRAVENOUS | Status: DC
  Filled 2017-03-04: qty 1000

## 2017-03-04 MED ORDER — HYDROXYZINE HCL 50 MG PO TABS
50.0000 mg | ORAL_TABLET | Freq: Four times a day (QID) | ORAL | Status: DC | PRN
  Filled 2017-03-04: qty 1

## 2017-03-04 MED ORDER — SOD CITRATE-CITRIC ACID 500-334 MG/5ML PO SOLN: 30.0000 mL | ORAL | Status: DC | PRN

## 2017-03-04 MED ORDER — LIDOCAINE HCL (PF) 1 % IJ SOLN
INTRAMUSCULAR | Status: DC | PRN
  Administered 2017-03-04: 6 mL via EPIDURAL
  Administered 2017-03-04: 4 mL

## 2017-03-04 MED ORDER — HYDROXYPROGESTERONE CAPROATE 250 MG/ML IM OIL: 250.0000 mg | TOPICAL_OIL | INTRAMUSCULAR | Status: DC

## 2017-03-04 NOTE — Anesthesia Pain Management Evaluation Note (Signed)
  CRNA Pain Management Visit Note  Patient: Cassandra Russell, 26 y.o., female  "Hello I am a member of the anesthesia team at [facility]. We have an anesthesia team available at all times to provide care throughout the hospital, including epidural management and anesthesia for C-section. I don't know your plan for the delivery whether it a natural birth, water birth, IV sedation, nitrous supplementation, doula or epidural, but we want to meet your pain goals."   1.Was your pain managed to your expectations on prior hospitalizations?   Yes   2.What is your expectation for pain management during this hospitalization?     IV pain meds and Nitrous Oxide, epidural at 2200 if still in labor.  3.How can we help you reach that goal? Patient is waiting to get epidural at 2200, 24 hours after last lovenox dose per Dr. Desmond Lope.  Record the patient's initial score and the patient's pain goal.   Pain: 5  Pain Goal: 5 [facility] wants you to be able to say your pain was always managed very well.  Terilyn Sano 03/04/2017

## 2017-03-04 NOTE — Progress Notes (Signed)
Vitals:   03/04/17 1839 03/04/17 2014  BP: 118/67 130/61  Pulse: 98 (!) 103  Resp: 16 18  Temp: 98.2 F (36.8 C) 98.6 F (37 C)   Wants an epidural, but can't get it until 2200 d/t Lovenox.  cx 6/100/0. FHR Cat 1. Will try IV meds again (didn't like Nitrous as much).

## 2017-03-04 NOTE — Progress Notes (Signed)
Patient ID: Cassandra Russell, female   DOB: 07-27-1991, 27 y.o.   MRN: [id number]  S: Patient seen & examined for progress of preterm PROM. Patient has nitrous at bedside, currently more comfortable than earlier. States she is feeling contractions, they wax/wane with frequency and especially intensity. She feels a lot of pressure during contractions, "like I have to poop". There pressure is not persistent.   O:  Vitals:   03/04/17 1350 03/04/17 1435 03/04/17 1436 03/04/17 1501  BP: 125/74  121/61 111/65  Pulse: 89  83 84  Resp: Temp:  98.4 F (36.9 C)    TempSrc:  Oral    SpO2:      Weight:      Height:        Dilation: 2 Effacement (%): 90 Station: 0 Presentation: Vertex Exam by:: MD Hillis Range  DID NOT PERFORM SVE  FHT: 140bpm, mod var, +10x10accels (occasional 15x15 accels), no decels TOCO: Irregular, q4-10 min   A/P: Continue latency antibiotics Limit/avoid SVE if possible S/P BMZ course x2 and Mag  Expectant management

## 2017-03-04 NOTE — MAU Note (Signed)
Pt states water broke at 0515-clear fluid. Pt denies contractions but has a lot of pelvic pressure. Has a cerclage in place. +FM. Denies vag bleeding.

## 2017-03-04 NOTE — H&P (Signed)
Obstetric History and Physical   HPI Cassandra Russell is a 26 y.o. G2P0101 at [redacted]w[redacted]d who presents with PPROM that occurred at 0515, clear fluid.  Patient was recently discharged after evaluation for PTL; received betamethasone  on 02/28/17 and 03/01/17.  Had NICU consult.  Was discharged on 03/02/17.   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 Had BMZ at 24 weeks. Care was transferred from [facility] ob/gyn to [facility] d/t high risk.  Since last night, she has had painful contractions every 4-5 minutes. Rates pain 7/10. Denies vaginal bleeding. Positive fetal movement.   Patient Active Problem List (as per [facility] Notes) . Occlusive thrombus of the right forearm 12/23/2016  Overview Note:  02/03/2017 Anit-Xa 0.99 01/01/2017 Anti-Xa 1.09 which is therapeutic and will continue on 80 mg BID for 3 months and check monthly Anti-Xa levels per Dr. Vincenza Hews then probably prophylactic the remainder of pregnancy  12/30/2016 70 mg BID of Lovenox heparin Assay at [facility] 0.55 will increase to 80 mg BID per Dr. Otho Perl and recheck assay on Friday after 3 doses.  Patient seen at [facility] 12/21/2016 and placed on Keflex 500 mg BID with no improvement in her redness and swelling. 12/23/2016 Dopplers with occlusive thrombus of the right forearm-  Plan 3 months of anticoagulation per Dr. Vincenza Hews  Thrombophilia work up negative at [facility], prothrombin gene mutation negative 01/20/2017 Last dose of Lovenox 2200 03/03/17  . Rh negative state in antepartum period, third trimester 12/15/2016  Overview Note:  02/03/2017 Rhogam given  . H/O preterm delivery, currently pregnant, third trimester 12/15/2016  Overview Note:  G1- Delivered at 25 weeks baby doing well, painless dilation to 4 cm followed by PTL  Makena weekly 16-36 weeks  . 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  Dareen Piano [facility]) at 16 weeks At 20 weeks patient had revision of cerclage with Dr. Gavin Potters due to bulging membranes. Vaginal progesterone was added  S/p NICU consult 12/15/2016 and 02/28/2017 S/p antenatal steroids 2/20 and 2/21 at 23 weeks  Patient OOW on decreased activity and pelvic rest   Prenatal labs: Dating: EDD of 04/27/2017 based on 09/09/2016 [place] at 7.1 weeks Initial prenatal labs: Blood type: A neg, antibody-neg, HIV NR, RPR NR, HepB NR, hgb 11.0, plts 254K, gc/chl neg/neg 28 wk: Hgb 11.7 , Plts 231K , HIV NR, RPR NR Genetic screening: declined  Anatomy US: normal in [place] and normal fetal echo 01/26/2017 Flu: 09/2016 per patient  Rhogam given 02/03/2017 1 hr GTT: 01/20/2017, passed 85 TDaP: 01/20/2017 GBS: 02/28/2017 Negative Plans for delivery: vaginal anticipated Contraception: Plans Mirena   OB History    Gravida Para Term Preterm AB Living   SAB TAB Ectopic Multiple Live Births           1     Past Medical History:  Diagnosis Date  . Chlamydia   . Conjunctivitis   . DVT (deep vein thrombosis) in pregnancy (HCC) 2018   arm  . History of chlamydia 04/13/2012  . PCOS (polycystic ovarian syndrome) 10/31/2014  . Preterm labor   . SVT (supraventricular tachycardia) (HCC) 10/2013   Adenosine was given in the ED ([facility]), pt was not admitted.   Past Surgical History:  Procedure Laterality Date  . CERVICAL CERCLAGE N/A 11/11/2016   Procedure: CERCLAGE CERVICAL;  Surgeon: Levi Aland, MD;  Location: [facility] ORS;  Service: Gynecology;  Laterality: N/A;  . CLOSED REDUCTION FINGER WITH PERCUTANEOUS PINNING Left 01/24/2014   Procedure: LEFT SMALL FINGER CLOSED REDUCTION WITH PINNING/POSSIBLE ORIF;  Surgeon: Sharma Covert, MD;  Location: [facility] OR;  Service: Orthopedics;  Laterality: Left;  . INTRAUTERINE DEVICE INSERTION  2009  . WISDOM TOOTH EXTRACTION     Family History: family history includes Hypertension in her mother. Social History:  reports that she  has never smoked. She has never used smokeless tobacco. She reports that she does not drink alcohol or use drugs.   Review of Systems  Constitutional: Negative for chills and fever.  HENT: Negative.   Eyes: Negative.   Respiratory: Negative.   Cardiovascular: Negative.  Negative for palpitations.  Gastrointestinal: Negative.   Genitourinary: Negative.   Musculoskeletal: Negative.   Skin: Negative.   Neurological: Negative.   Endo/Heme/Allergies: Negative.   Psychiatric/Behavioral: Negative.    History   Blood pressure 126/80, pulse 85, temperature 98.4 F (36.9 C), temperature source Oral, resp. rate 16, height  (1.549 m), weight 165 lb (74.8 kg), last menstrual period 07/21/2016, SpO2 98 %. Exam Physical Exam  Constitutional: She is oriented to person, place, and time. She appears well-developed and well-nourished.  HENT:  Head: Normocephalic and atraumatic.  Eyes: Conjunctivae and EOM are normal. Pupils are equal, round, and reactive to light.  Neck: Normal range of motion. Neck supple.  Cardiovascular: Regular rhythm.   Tachycardic  Respiratory: Effort normal and breath sounds normal.  GI: Soft. There is no tenderness.  Musculoskeletal: Normal range of motion.  Neurological: She is alert and oriented to person, place, and time. She has normal reflexes.  Skin: Skin is warm and dry.  Psychiatric: She has a normal mood and affect. Her behavior is normal. Judgment and thought content normal.    Sterile speculum exam:  Grossly ruptured, +fern,  +Amnisure.  Increased tension on cerclages.  Cerclages removed.  Cervix visually 1 cm/80/-3.  Bedside Ultrasound: Cephalic presentation. Fetal Heart Rate A  Mode External filed at 03/04/2017 8295  Baseline Rate (A) 145 bpm filed at 03/04/2017 6213  Variability 6-25 BPM filed at 03/04/2017 0814  Accelerations 15 x 15 filed at 03/04/2017 0865  Decelerations None filed at 03/04/2017 7846  Fetal Heart Rate Fetus B  Contractions  q4-5 minutes   Results Results for orders placed or performed during the hospital encounter of 03/04/17 (from the past 72 hour(s))  Urinalysis, Routine w reflex microscopic     Status: None   Collection Time: 03/04/17  6:31 AM  Result Value Ref Range   Color, Urine YELLOW YELLOW   APPearance CLEAR CLEAR   Specific Gravity, Urine 1.016 1.005 - 1.030   pH 6.0 5.0 - 8.0   Glucose, UA NEGATIVE NEGATIVE mg/dL   Hgb urine dipstick NEGATIVE NEGATIVE   Bilirubin Urine NEGATIVE NEGATIVE   Ketones, ur NEGATIVE NEGATIVE mg/dL   Protein, ur NEGATIVE NEGATIVE mg/dL   Nitrite NEGATIVE NEGATIVE   Leukocytes, UA NEGATIVE NEGATIVE  Amnisure rupture of membrane (rom)not at [facility]     Status: None   Collection Time: 03/04/17  7:02 AM  Result Value Ref Range   Amnisure ROM POSITIVE   CBC     Status: Abnormal   Collection Time: 03/04/17  7:45 AM  Result Value Ref Range   WBC 15.7 (H) 4.0 - 10.5 K/uL   RBC 4.19 3.87 - 5.11 MIL/uL   Hemoglobin 12.8 12.0 - 15.0 g/dL   HCT 96.2 95.2 -  46.0 %   MCV 90.5 78.0 - 100.0 fL   MCH 30.5 26.0 - 34.0 pg   MCHC 33.8 30.0 - 36.0 g/dL   RDW 16.1 09.6 - 04.5 %   Platelets 274 150 - 400 K/uL    Assessment/Plan: PPROM/Preterm labor at [redacted]w[redacted]d  - Will admit to L&D, start latency antibiotics, expectant management for now.  GBS negative. - She is s/p magnesium sulfate for CP prophylaxis on 02/28/17 and betamethasone regimen 02/28/17 and 03/01/17 - Discussed patient's condition with Dr. Alpha Gula (MFM) as she is their patient and he agreed with cerclage removal and subsequent care for PPROM. - Reassuring and reactive fetal status. - Continue to hold Lovenox for now; SCDs ordered - Hopeful for vaginal delivery if preterm labor progresses; fetus has cephalic presentation. - Continue close observation.    Jaynie Collins, MD, FACOG Attending Obstetrician & Gynecologist Faculty Practice, [facility]

## 2017-03-04 NOTE — MAU Note (Signed)
Cerclage x 2 removed by Dr. Macon Large.  Vertex presentation confirmed by sonosite U/S.

## 2017-03-04 NOTE — Anesthesia Procedure Notes (Signed)
Epidural Patient location during procedure: OB  Staffing Anesthesiologist: Dodge Ator  Preanesthetic Checklist Completed: patient identified, site marked, surgical consent, pre-op evaluation, timeout performed, IV checked, risks and benefits discussed and monitors and equipment checked  Epidural Patient position: sitting Prep: site prepped and draped and DuraPrep Patient monitoring: continuous pulse ox and blood pressure Approach: midline Location: L3-L4 Injection technique: LOR air  Needle:  Needle type: Tuohy  Needle gauge: 17 G Needle length: 9 cm and 9 Needle insertion depth: 4 cm Catheter type: closed end flexible Catheter size: 19 Gauge Catheter at skin depth: 10 cm Test dose: negative  Assessment Events: blood not aspirated, injection not painful, no injection resistance, negative IV test and no paresthesia  Additional Notes Dosing of Epidural:  1st dose, through catheter .............................................  Xylocaine 40 mg  2nd dose, through catheter, after waiting 3 minutes.........Xylocaine 60 mg    As each dose occurred, patient was free of IV sx; and patient exhibited no evidence of SA injection.  Patient is more comfortable after epidural dosed. Please see RN's note for documentation of vital signs,and FHR which are stable.  Patient reminded not to try to ambulate with numb legs, and that an RN must be present when she attempts to get up.        

## 2017-03-04 NOTE — Progress Notes (Signed)
Labor Progress Note  S: Called to room by nursing that patient complaining of pelvic pressure and pain and feels like she needs to have a BM  O:  BP 125/74 (BP Location: Right Arm)   Pulse 89   Temp 99.7 F (37.6 C) (Oral)   Resp 16   Ht  (1.549 m)   Wt 74.8 kg (165 lb)   LMP 07/21/2016   SpO2 98%   BMI 31.18 kg/m  EFM: 145, moderate variability, +accels, no decels [id number]: 2 Effacement (%): 90 Station: 0 Presentation: Vertex Exam by:: MD Hillis Range   A&P: 26 y.o. G2P0101 [redacted]w[redacted]d PPROM #Labor:expectant mgmt, latency abx, minimize cervical checks #FWB: fentanyl, epidural in active labor #GBS: negative   Durenda Hurt, MD Resident Physician 12:46 PM

## 2017-03-05 ENCOUNTER — Encounter (HOSPITAL_COMMUNITY): Payer: Self-pay

## 2017-03-05 MED ORDER — BENZOCAINE-MENTHOL 20-0.5 % EX AERO: 1.0000 "application " | INHALATION_SPRAY | CUTANEOUS | Status: DC | PRN

## 2017-03-05 MED ORDER — HEPARIN SODIUM (PORCINE) 5000 UNIT/ML IJ SOLN
5000.0000 [IU] | Freq: Two times a day (BID) | INTRAMUSCULAR | Status: AC
  Administered 2017-03-05: 5000 [IU] via SUBCUTANEOUS
  Filled 2017-03-05: qty 1

## 2017-03-05 MED ORDER — FERROUS SULFATE 325 (65 FE) MG PO TABS
325.0000 mg | ORAL_TABLET | Freq: Two times a day (BID) | ORAL | Status: DC
  Administered 2017-03-05 – 2017-03-07 (×5): 325 mg via ORAL
  Filled 2017-03-05 (×6): qty 1

## 2017-03-05 MED ORDER — COCONUT OIL OIL
1.0000 "application " | TOPICAL_OIL | Status: DC | PRN
  Administered 2017-03-05: 1 via TOPICAL
  Filled 2017-03-05: qty 120

## 2017-03-05 MED ORDER — ZOLPIDEM TARTRATE 5 MG PO TABS: 5.0000 mg | ORAL_TABLET | Freq: Every evening | ORAL | Status: DC | PRN

## 2017-03-05 MED ORDER — ENOXAPARIN SODIUM 80 MG/0.8ML ~~LOC~~ SOLN
75.0000 mg | Freq: Two times a day (BID) | SUBCUTANEOUS | Status: DC
  Administered 2017-03-06 – 2017-03-07 (×4): 75 mg via SUBCUTANEOUS
  Filled 2017-03-05 (×6): qty 0.8

## 2017-03-05 MED ORDER — DOCUSATE SODIUM 100 MG PO CAPS
100.0000 mg | ORAL_CAPSULE | Freq: Two times a day (BID) | ORAL | Status: DC
  Administered 2017-03-05 – 2017-03-07 (×4): 100 mg via ORAL
  Filled 2017-03-05 (×4): qty 1

## 2017-03-05 MED ORDER — ONDANSETRON HCL 4 MG/2ML IJ SOLN: 4.0000 mg | INTRAMUSCULAR | Status: DC | PRN

## 2017-03-05 MED ORDER — IBUPROFEN 600 MG PO TABS
600.0000 mg | ORAL_TABLET | Freq: Four times a day (QID) | ORAL | Status: DC
  Administered 2017-03-05 – 2017-03-07 (×10): 600 mg via ORAL
  Filled 2017-03-05 (×10): qty 1

## 2017-03-05 MED ORDER — ONDANSETRON HCL 4 MG PO TABS: 4.0000 mg | ORAL_TABLET | ORAL | Status: DC | PRN

## 2017-03-05 MED ORDER — OXYCODONE HCL 5 MG PO TABS: 5.0000 mg | ORAL_TABLET | ORAL | Status: DC | PRN

## 2017-03-05 MED ORDER — METHYLERGONOVINE MALEATE 0.2 MG/ML IJ SOLN: 0.2000 mg | INTRAMUSCULAR | Status: DC | PRN

## 2017-03-05 MED ORDER — PRENATAL MULTIVITAMIN CH
1.0000 | ORAL_TABLET | Freq: Every day | ORAL | Status: DC
  Administered 2017-03-05 – 2017-03-07 (×3): 1 via ORAL
  Filled 2017-03-05 (×3): qty 1

## 2017-03-05 MED ORDER — DIPHENHYDRAMINE HCL 25 MG PO CAPS: 25.0000 mg | ORAL_CAPSULE | Freq: Four times a day (QID) | ORAL | Status: DC | PRN

## 2017-03-05 MED ORDER — WITCH HAZEL-GLYCERIN EX PADS: 1.0000 "application " | MEDICATED_PAD | CUTANEOUS | Status: DC | PRN

## 2017-03-05 MED ORDER — ACETAMINOPHEN 325 MG PO TABS
650.0000 mg | ORAL_TABLET | ORAL | Status: DC | PRN
  Administered 2017-03-07: 650 mg via ORAL
  Filled 2017-03-05: qty 2

## 2017-03-05 MED ORDER — METHYLERGONOVINE MALEATE 0.2 MG PO TABS: 0.2000 mg | ORAL_TABLET | ORAL | Status: DC | PRN

## 2017-03-05 MED ORDER — MEASLES, MUMPS & RUBELLA VAC ~~LOC~~ INJ
0.5000 mL | INJECTION | Freq: Once | SUBCUTANEOUS | Status: DC
  Filled 2017-03-05: qty 0.5

## 2017-03-05 MED ORDER — SIMETHICONE 80 MG PO CHEW: 80.0000 mg | CHEWABLE_TABLET | ORAL | Status: DC | PRN

## 2017-03-05 MED ORDER — DIBUCAINE 1 % RE OINT: 1.0000 "application " | TOPICAL_OINTMENT | RECTAL | Status: DC | PRN

## 2017-03-05 MED ORDER — BISACODYL 10 MG RE SUPP: 10.0000 mg | Freq: Every day | RECTAL | Status: DC | PRN

## 2017-03-05 MED ORDER — ENOXAPARIN SODIUM 120 MG/0.8ML ~~LOC~~ SOLN
1.5000 mg/kg | SUBCUTANEOUS | Status: DC
  Filled 2017-03-05: qty 0.8

## 2017-03-05 MED ORDER — TETANUS-DIPHTH-ACELL PERTUSSIS 5-2.5-18.5 LF-MCG/0.5 IM SUSP: 0.5000 mL | Freq: Once | INTRAMUSCULAR | Status: DC

## 2017-03-05 MED ORDER — FLEET ENEMA 7-19 GM/118ML RE ENEM: 1.0000 | ENEMA | Freq: Every day | RECTAL | Status: DC | PRN

## 2017-03-05 MED ORDER — OXYCODONE HCL 5 MG PO TABS: 10.0000 mg | ORAL_TABLET | ORAL | Status: DC | PRN

## 2017-03-05 NOTE — Anesthesia Postprocedure Evaluation (Signed)
Anesthesia Post Note  Patient: Tereka Thorley Phillipson  Procedure(s) Performed: * No procedures listed *  Patient location during evaluation: Mother Baby Anesthesia Type: Epidural Level of consciousness: awake and alert and oriented Pain management: satisfactory to patient Vital Signs Assessment: post-procedure vital signs reviewed and stable Respiratory status: spontaneous breathing and nonlabored ventilation Cardiovascular status: stable Postop Assessment: no headache, no backache, no signs of nausea or vomiting, adequate PO intake and patient able to bend at knees (patient up walking) Anesthetic complications: no        Last Vitals:  Vitals:   03/05/17 0420 03/05/17 0927  BP: 106/60 (!) 107/55  Pulse: 98 97  Resp: 16 16  Temp: 36.8 C 36.9 C    Last Pain:  Vitals:   03/05/17 1416  TempSrc:   PainSc: 0-No pain   Pain Goal: Patients Stated Pain Goal: 3 (03/05/17 0318)               Madison Hickman

## 2017-03-05 NOTE — Lactation Note (Signed)
This note was copied from a baby's chart. Lactation Consultation Note  Patient Name: Cassandra Russell ZOXWR'[id number] Date: 03/05/2017 Reason for consult: Initial assessment;NICU baby   With this mom of a NICU baby, now 75 hours old, and 32 3/[redacted] weeks gestation. I assisted mom with pumping, and reviewed hand expression and the nICU booklet on providing EBm for a NICU baby. Mom was able to pump about 2 ml's of colostrum, and hand expression taught, with good return demonstration. Mom kows to pump at least 8 times a day, and to add HE each time. Mom knows to call for questions/concerns as neded.    Maternal Data Formula Feeding for Exclusion: Yes (baby in NICU) Has patient been taught Hand Expression?: Yes Does the patient have breastfeeding experience prior to this delivery?: Yes  Feeding Feeding Type: Donor Breast Milk Length of feed: 30 min  LATCH Score/Interventions                      Lactation Tools Discussed/Used WIC Program: Yes (fax sent to [employer] for mom to get a DEP) Pump Review: Setup, frequency, and cleaning;Milk Storage;Other (comment) (pump settings, hand expression) Initiated by:: Bedside RN  Date initiated:: 03/05/17   Consult Status Consult Status: Follow-up Date: 03/06/17 Follow-up type: In-patient    Alfred Levins 03/05/2017, 5:41 PM

## 2017-03-05 NOTE — Progress Notes (Signed)
Per Philipp Deputy CMN, patient does not need additional dose of Rhogam at this time.

## 2017-03-05 NOTE — Progress Notes (Signed)
Patient ID: Cassandra Russell, female   DOB: 18-Jun-1991, 26 y.o.   MRN: [id number] Pt with h/o upper ex DVT with pregnancy as her only risk factor. She is at increased risk of a DVT.  D/w anesthesia. Will begin heparin for now x 1 dose and restart Lovenox 24 hours from epidural removal.  Daiveon Markman L. Harraway-Smith, M.D., Evern Core

## 2017-03-06 LAB — CBC
HCT: 33.2 % — ABNORMAL LOW (ref 36.0–46.0)
Hemoglobin: 11.1 g/dL — ABNORMAL LOW (ref 12.0–15.0)
MCH: 30.3 pg (ref 26.0–34.0)
MCHC: 33.4 g/dL (ref 30.0–36.0)
MCV: 90.7 fL (ref 78.0–100.0)
Platelets: 234 10*3/uL (ref 150–400)
RBC: 3.66 MIL/uL — ABNORMAL LOW (ref 3.87–5.11)
RDW: 13.1 % (ref 11.5–15.5)
WBC: 12.5 10*3/uL — ABNORMAL HIGH (ref 4.0–10.5)

## 2017-03-06 NOTE — Progress Notes (Signed)
Post Partum Day 1 Subjective: no complaints, voiding and tolerating PO  Objective: Blood pressure (!) 113/57, pulse 98, temperature 98.5 F (36.9 C), temperature source Oral, resp. rate 18, height  (1.549 m), weight 165 lb (74.8 kg), last menstrual period 07/21/2016, SpO2 100 %, unknown if currently breastfeeding.  Physical Exam:  General: alert, cooperative and no distress Lochia: appropriate Uterine Fundus: firm Incision:  DVT Evaluation: No evidence of DVT seen on physical exam.   Recent Labs  03/04/17 0745 03/06/17 0519  HGB 12.8 11.1*  HCT 37.9 33.2*    Assessment/Plan: Plan for discharge tomorrow  Baby in NICU   LOS: 2 days   Mayline Dragon H 03/06/2017, 2:39 PM

## 2017-03-07 MED ORDER — FERROUS SULFATE 325 (65 FE) MG PO TABS: 325.0000 mg | ORAL_TABLET | Freq: Two times a day (BID) | ORAL | 3 refills | Status: DC

## 2017-03-07 MED ORDER — IBUPROFEN 600 MG PO TABS: 600.0000 mg | ORAL_TABLET | Freq: Four times a day (QID) | ORAL | 0 refills | Status: DC

## 2017-03-07 NOTE — Discharge Instructions (Signed)

## 2017-03-07 NOTE — Progress Notes (Signed)
Patient discharged home with husband. Discharge paperwork, instructions, home care, prescriptions, and follow-up appts reviewed. No questions at this time.

## 2017-03-07 NOTE — Discharge Summary (Signed)
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 yo G2P0202 at [redacted]w[redacted]d was admitted in Latent Labor on 03/04/2017.Pt had cerclage x2 removed due to advance cervical effacement and reg uterine contractions. Patient had an uncomplicated labor course as follows:  Membrane Rupture Time/Date: 5:15 AM ,03/04/2017   Intrapartum Procedures: Episiotomy: None [1]                                         Lacerations:  None [1]  Patient had a delivery of a Viable infant. 03/05/2017  Information for the patient's newborn:  Cassandra, Russell [[id number]]  Delivery Method: Vaginal, Spontaneous Delivery (Filed from Delivery Summary) Pt was started on Heparin 8 hours after delivery and then was restarted on Lovenox 24 hours post delivery.    [name] had an uncomplicated postpartum course.  She is ambulating, tolerating a regular diet, passing flatus, and urinating well. Patient is discharged home in stable condition on 03/07/17.   Physical exam  Vitals:   03/06/17 0755 03/06/17 1342 03/07/17 0702 03/07/17 0812  BP:  96/84 (!) 113/57 115/66 112/62  Pulse: 73 98 71 79  Resp: Temp: 98 F (36.7 C) 98.5 F (36.9 C) 98.2 F (36.8 C) 97.6 F (36.4 C)  TempSrc: Oral Oral Oral Oral  SpO2: 100% 100% 100% 99%  Weight:      Height:       General: alert, cooperative and no distress Lochia: appropriate Uterine Fundus: firm Incision: N/A DVT Evaluation: No evidence of DVT seen on physical exam. Labs: Lab Results  Component Value Date   WBC 12.5 (H) 03/06/2017   HGB 11.1 (L) 03/06/2017   HCT 33.2 (L) 03/06/2017   MCV 90.7 03/06/2017   PLT 234 03/06/2017   CMP Latest Ref Rng & Units 10/01/2014  Glucose 70 - 99 mg/dL 80  BUN 6 - 23 mg/dL 16  Creatinine 2.44 - 0.10 mg/dL 2.72  Sodium 536 - 644 mEq/L 141  Potassium 3.5 - 5.3 mEq/L 3.9  Chloride 96 - 112 mEq/L 103  CO2  19 - 32 mEq/L 28  Calcium 8.4 - 10.5 mg/dL 9.4  Total Protein 6.0 - 8.3 g/dL 6.9  Total Bilirubin 0.2 - 1.2 mg/dL 0.3  Alkaline Phos 39 - 117 U/L 57  AST 0 - 37 U/L 15  ALT 0 - 35 U/L 10    Discharge instruction: per After Visit Summary and "Baby and Me Booklet".  After visit meds:  Allergies as of 03/07/2017   No Known Allergies     Medication List    STOP taking these medications   calcium carbonate 500 MG chewable tablet Commonly known as:  TUMS - dosed in mg elemental calcium   hydroxyprogesterone caproate 250 mg/mL Oil injection Commonly known as:  MAKENA   progesterone 200 MG capsule Commonly known as:  PROMETRIUM     TAKE these medications   enoxaparin 80 MG/0.8ML injection Commonly known as:  LOVENOX Inject 80 mg into the skin 2 (two) times daily.   ferrous sulfate 325 (65 FE) MG tablet Take 1 tablet (325 mg total) by mouth 2 (two) times daily with a meal.   ibuprofen 600 MG tablet Commonly known as:  ADVIL,MOTRIN Take 1 tablet (600 mg total) by mouth every 6 (six) hours.   PRENATAL GUMMIES/DHA & FA 0.4-32.5 MG Chew Chew 2 each by mouth daily.       Diet: routine diet  Activity:  Advance as tolerated. Pelvic rest for 6 weeks.   Outpatient follow up:2 weeks  Pt was followed by MFM at [facility].  She would like to resume her care there.  She will call and make a f/u appt for 2 weeks  Postpartum contraception: IUD pt will get at her GYN Dr. Lily Peer once she is discharged from her PP care at [facility]  Newborn Data: Live born female  Birth Weight: 4 lb 3.7 oz (1920 g) APGAR: 8, 9  Baby Feeding: Breast Disposition:NICU   03/07/2017 Cassandra Rosenthal, MD

## 2017-03-07 NOTE — Lactation Note (Signed)
This note was copied from a baby's chart. Lactation Consultation Note  Patient Name: Cassandra Russell [id number] Date: 03/07/2017 Reason for consult: Follow-up assessment;NICU baby;Infant < 6lbs;Pump rental   Follow up with mom of 57 hour old NICU infant. Infant is doing well per mom and mom reports she has been able to hold him. She reports she is pumping every 2-3 hours and obtaining small amounts of colostrum. She had a bottle of about 5 cc in the room when LC there.   Mom rented a [facility] loaner. She has an appointment with [facility] on Thursday. She reports she has WIC in [facility] but lives in [place]. WIC referral refaxed to [facility] office. [facility] loaner completed.   Engorgement prevention/treatment reviewed. Mom has bottles and labels for home use. Enc mom to continue to pump every 2-3 hours for 8-12 pumpings a day. Enc mom to use pumps in NICU when visiting infant, mom reports she gets more colostrum after holding infant. Mom without further questions/concerns at this time. Follow up Prn in NICU.    Maternal Data Has patient been taught Hand Expression?: Yes Does the patient have breastfeeding experience prior to this delivery?: Yes  Feeding Feeding Type: Donor Breast Milk Length of feed: 30 min  LATCH Score/Interventions                      Lactation Tools Discussed/Used Pump Review: Setup, frequency, and cleaning;Milk Storage Initiated by:: Reviewed   Consult Status Consult Status: PRN Follow-up type: Call as needed    Ed Blalock 03/07/2017, 11:01 AM

## 2017-03-08 LAB — TYPE AND SCREEN
ABO/RH(D): A NEG
ANTIBODY SCREEN: POSITIVE
DAT, IGG: NEGATIVE
Unit division: 0
Unit division: 0

## 2017-03-08 LAB — BPAM RBC
Blood Product Expiration Date: 201805232359
Blood Product Expiration Date: 201805232359
UNIT TYPE AND RH: 600
Unit Type and Rh: 600

## 2017-03-15 ENCOUNTER — Ambulatory Visit: Payer: Self-pay

## 2017-03-15 NOTE — Lactation Note (Signed)
This note was copied from a baby's chart. Lactation Consultation Note  Patient Name: Cassandra Russell ZOXWR'UToday's Date: 03/15/2017 Reason for consult: Follow-up assessment;NICU baby;Infant < 6lbs   Follow up with mom in NICU with 10 day old infant. Mom reports she is pumping every 2-3 hours and obtaining about an oz/breast. She is not able to put infant to breast yet. She has no questions/concerns at this time.    Maternal Data Formula Feeding for Exclusion: No  Feeding Feeding Type: Breast Milk Length of feed: 60 min  LATCH Score/Interventions                      Lactation Tools Discussed/Used Pump Review: Setup, frequency, and cleaning Initiated by:: Reviewed   Consult Status Consult Status: PRN Follow-up type: Call as needed    Ed BlalockSharon S Deshonda Cryderman 03/15/2017, 2:28 PM

## 2017-03-23 ENCOUNTER — Ambulatory Visit: Payer: Self-pay

## 2017-03-23 NOTE — Lactation Note (Signed)
This note was copied from a baby's chart. Lactation Consultation Note  Patient Name: Boy Carolynn CommentLeslie Kulaga ZOXWR'UToday's Date: 03/23/2017 Reason for consult: Follow-up assessment;NICU baby  NICU baby 712 weeks old. Mom reports that she was initially pumping every 2-3 hours, even through the night, but then became so exhausted that she has just been pumping 4-6 times/24 hours. Discussed the need to pump at least 8 times/day and how her EBM supply will decrease. Enc sleeping 4-5 hours at night, then pumper every 2 hours for 3 times, followed by pumping every 3 hours for the remainder of the day. Mom discussed that this might be more manageable.   Assisted mom to latch baby in cross-cradle position to left breast. Enc mom to hand express and mom had a drop of EBM on nipple. Baby had a wide gape and latched deeply. Baby continued suckling rhythmically with some swallows noted. Enc mom to latch baby to opposite breast at next attempt. Provided anticipatory guidance regarding baby's progression to nursing at the breast.  Maternal Data    Feeding Feeding Type: Breast Fed Nipple Type: Slow - flow Length of feed:  (LC assisted with the latch and first few minutes of BF.)  LATCH Score/Interventions                      Lactation Tools Discussed/Used     Consult Status Consult Status: PRN    Sherlyn HayJennifer D Blanche Gallien 03/23/2017, 12:11 PM

## 2017-03-24 ENCOUNTER — Encounter: Payer: Self-pay | Admitting: Gynecology

## 2017-03-24 ENCOUNTER — Ambulatory Visit: Payer: Self-pay

## 2017-03-24 NOTE — Lactation Note (Signed)
This note was copied from a baby's chart. Lactation Consultation Note  Patient Name: Cassandra Russell CommentLeslie Riccobono ZOXWR'UToday's Date: 03/24/2017 Reason for consult: Follow-up assessment;NICU baby   Spoke with mom at infant bedside. Mom reports pumping is going well. She reports she was able to put infant to breast yesterday. She reports he is improving on his oral feedings. She has no questions/concerns at this time.    Maternal Data    Feeding Feeding Type: Breast Milk Nipple Type: Slow - flow Length of feed: 40 min (10 PO 30 NG)  LATCH Score/Interventions                      Lactation Tools Discussed/Used     Consult Status Consult Status: PRN Follow-up type: Call as needed    Ed BlalockSharon S Hadlea Furuya 03/24/2017, 11:22 AM

## 2017-04-07 ENCOUNTER — Ambulatory Visit: Payer: Self-pay

## 2017-04-07 NOTE — Lactation Note (Signed)
This note was copied from a baby's chart. Lactation Consult for [place] (DOB: [birth year]) and mother, Cassandra Russell  Mother's reason for visit: help w/breastfeeding former NICU baby Consult:  Initial Lactation Consultant:  Remigio EisenmengerRichey, Evony Rezek Hamilton  ________________________________________________________________________ BW: 1920g 4# 3.7oz  D/c wt on 5-22: 2630g 5-23: 2651g (5# 13.5oz) 5-26: 2736g (6# 0.5oz) Today's weight without diaper: 2820g (about 6# 3.5) Today's weight with diaper: 2836g (6# 4.1oz) ________________________________________________________________________  Mother's Name: Cassandra Russell Type of delivery:  Vaginal, Spontaneous Delivery Breastfeeding Experience: pumped for 1st child who was born at 3425 weeks Maternal Medical Conditions:  Polycystic ovarian syndrome. Diagnosed in 2015 Maternal Medications: PNV  ________________________________________________________________________  Breastfeeding History (Post Discharge)  Frequency of breastfeeding: At night once or twice Duration of feeding: 15 min  Supplementation  Breastmilk: 8 bottles/day, 8545mL-60mL  Method:  Bottle, 1/4tsp of Neosure to 60mL  Pumping  Type of pump:  Medela pump in style Frequency: 5 times/day for 20 min Volume: 3-4 oz/session  Infant Intake and Output Assessment  Voids: 8 in 24 hrs.  Color:  Clear yellow Stools: about 3 q2-3d in 24 hrs.  Color:  Yellow-brown  ________________________________________________________________________  Maternal Breast Assessment  Breast:  Full Nipple:  Erect _______________________________________________________________________ Feeding Assessment/Evaluation  Initial feeding assessment:  Infant's oral assessment:  WNL  Attached assessment:  Deep  Lips flanged:  Yes.     Suck assessment:  Nutritive  Pre-feed weight: 2836 g  (6# 4.1 oz.) Post-feed weight: 2858 g  Amount transferred: 22 ml, in 9 min R  breast, cross-cradle  Pre-feed weight: 2858 g   Post-feed weight: 2894 g Amount transferred: 36 ml, in 12 min L breast, cross-cradle  Total amount transferred: 58 ml  Cassandra, born at 989w3d now has a CGA of 7363w1d. He has gained 100g over 4 days (25g/day). Mom fortifies her breast milk, but she does not fortify 2 feedings/day b/c he feeds at the breast twice at night. If Mom didn't have to fortify her breast milk, she would feed at the breast more.  I observed Cassandra latch w/ease. He transferred 58 mL in 21 min, which is efficient milk transfer for an infant with a CGA of 37 weeks.   Mom will continue pumping. She has been using 21 flanges when pumping, but I encouraged that she lubricate the inside of the flanges with coconut oil prior to pumping to see if that improves fit. Mom knows to try her size 24 flanges if her nipples need more room when pumping.    I am confident that [place] would do well if he were offered the breast more times/day. I encouraged Mom to speak with her NP/MD about expected weight gain for an infant born preterm, but now at 37 weeks. If her NP/MD would permit more feedings at the breast & less fortification with a bottle, I recommended that Mom have Cassandra weighed about every 3 days to ensure continued weight gain. Mom knows she is welcome to attend our free breastfeeding support groups on Tuesday mornings & weigh him then, also.   Glenetta HewKim Layah Skousen, RN, IBCLC

## 2017-10-23 IMAGING — US US MFM OB TRANSVAGINAL
1 series · 14 of 28 positions shown · non-contrast
Comparison: none

[Series 1: us mfm ob transvaginal · 55 acquisitions, 14 frames shown]
[im 3/55]
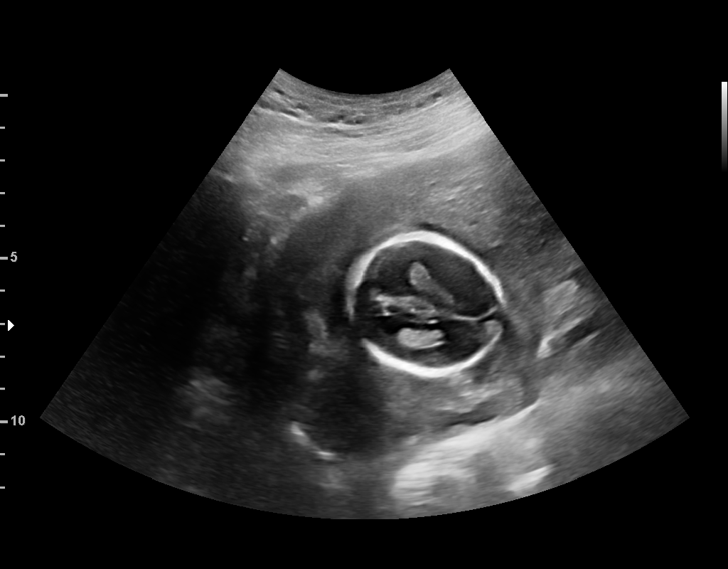
[im 7/55]
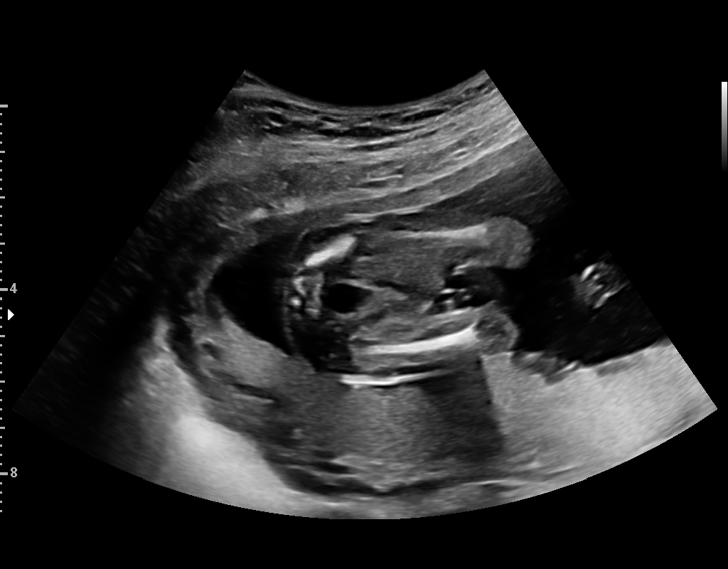
[im 11/55]
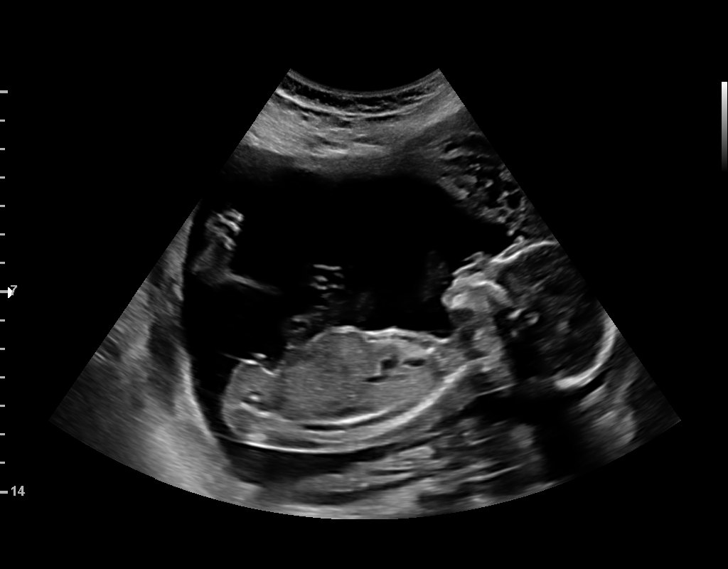
[im 15/55]
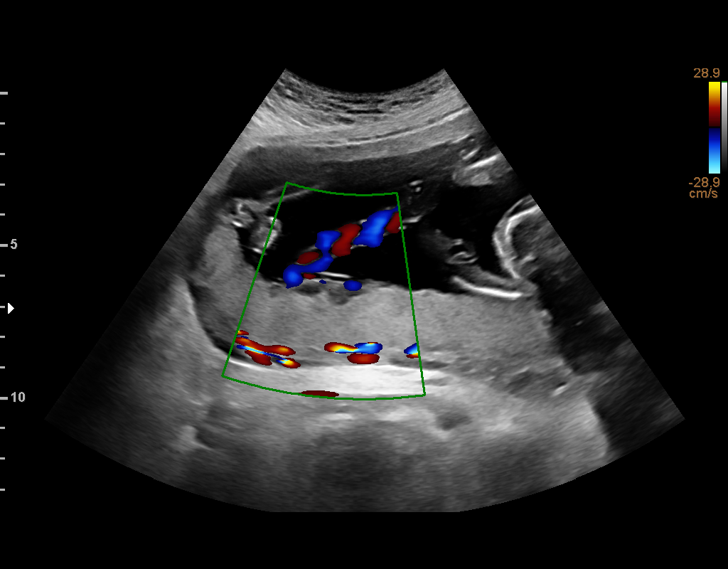
[im 19/55]
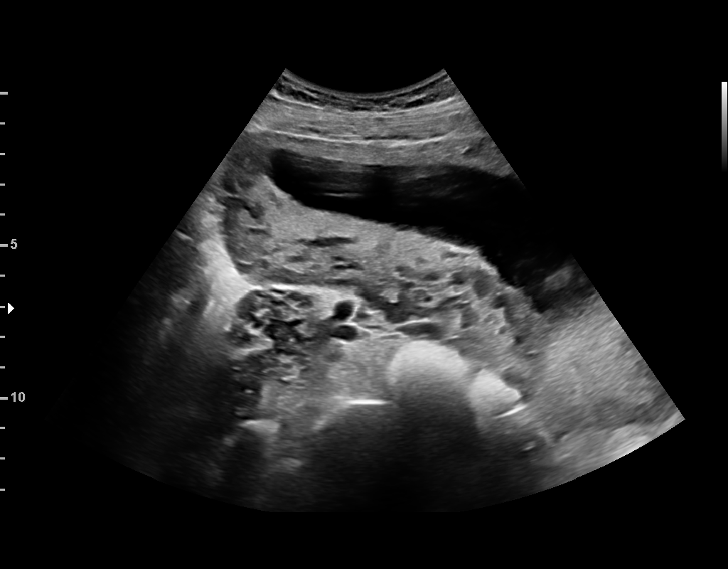
[im 23/55]
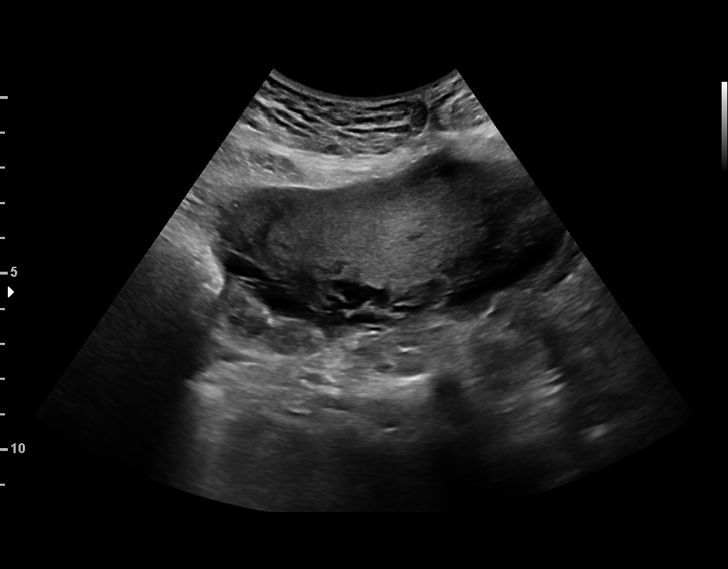
[im 27/55]
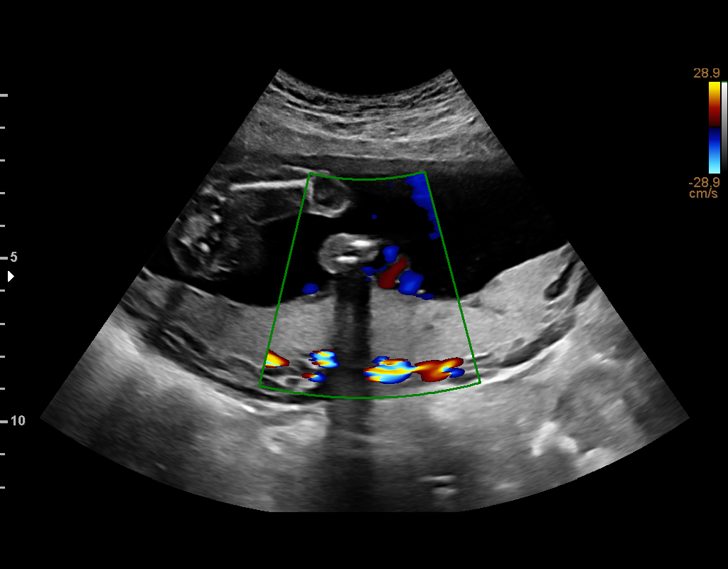
[im 31/55]
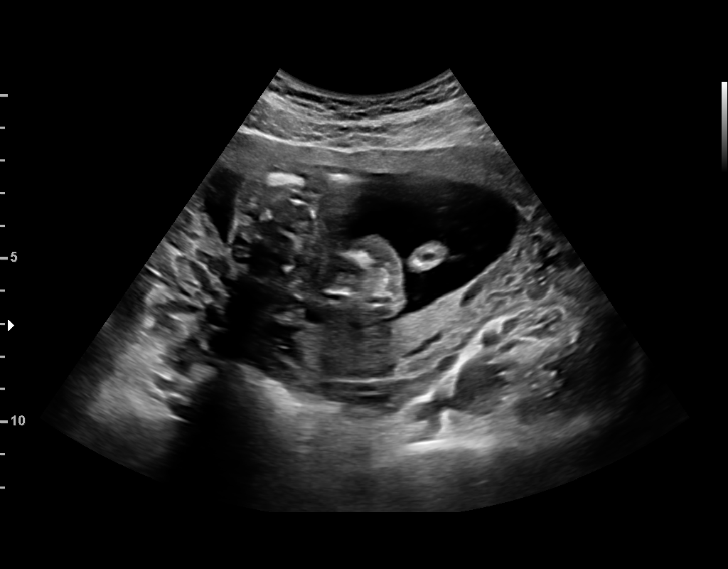
[im 35/55]
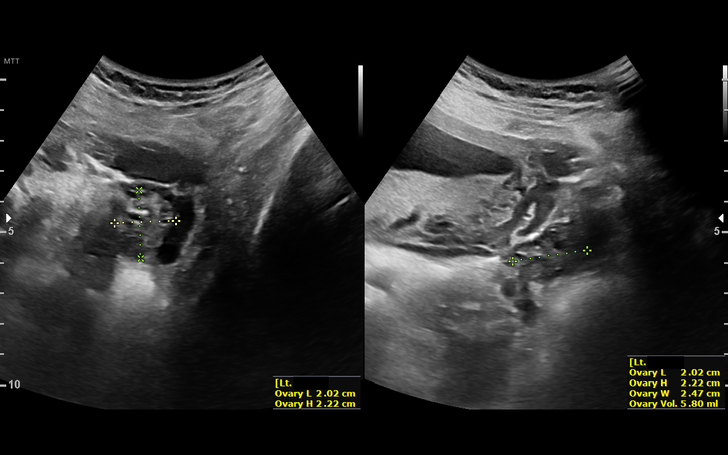
[im 39/55]
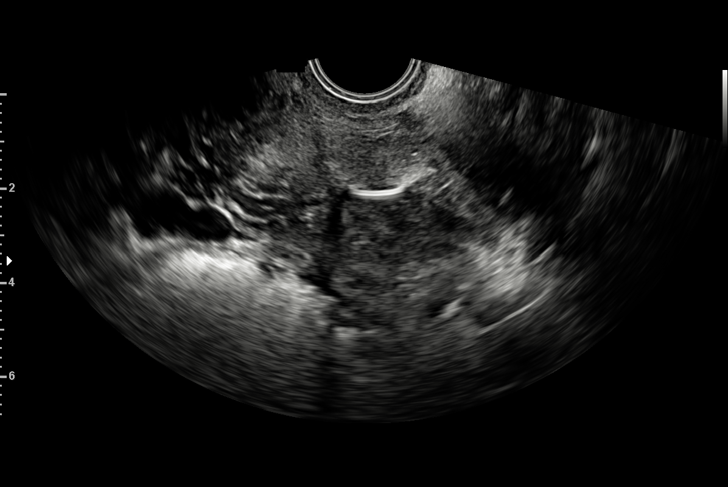
[im 43/55]
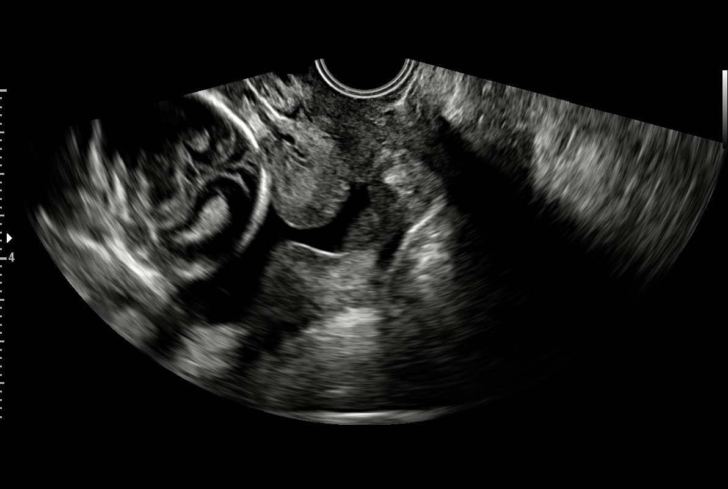
[im 47/55]
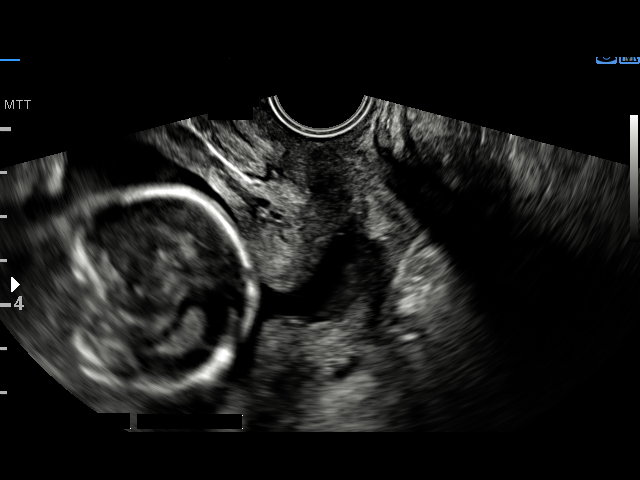
[im 51/55]
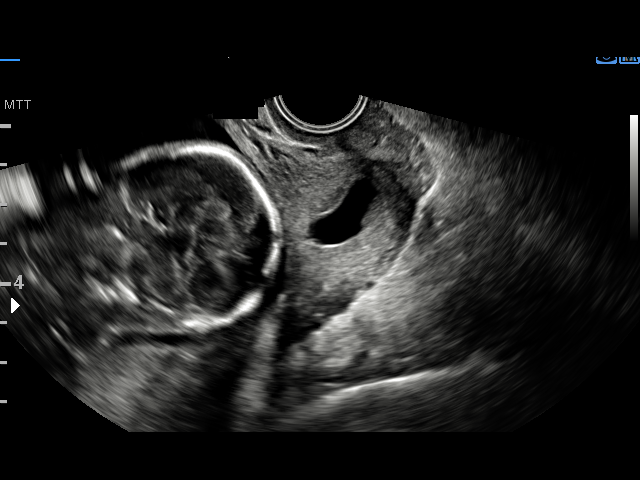
[im 55/55]
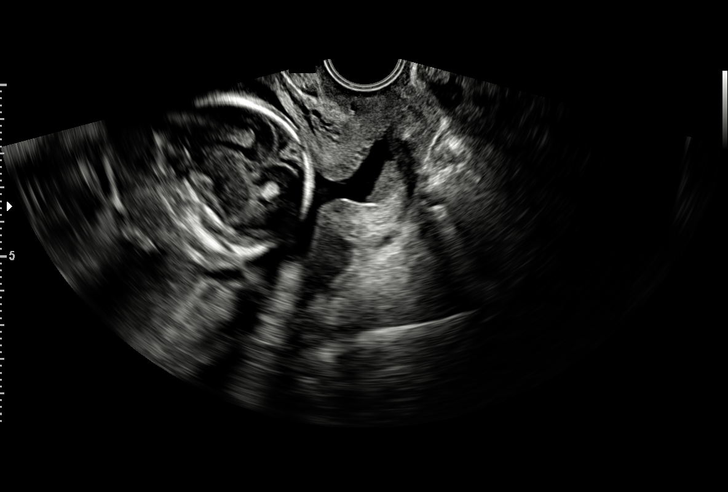

[14 of 28 positions shown; findings below may reference images not displayed]

OETILE

[id number]

1  DAMIANOU VAFEADES              [id number]      [id number]     [id number]
Indications

18 weeks gestation of pregnancy
Cervical incompetence, second trimester
Cervical cerclage suture present, second
trimester
Poor obstetric history: Previous preterm
delivery, antepartum
OB History

Gravidity:    2         Term:   1        Prem:   0        SAB:   0
TOP:          0       Ectopic:  0        Living: 1
Fetal Evaluation

Num Of Fetuses:     1
Fetal Heart         142
Rate(bpm):
Cardiac Activity:   Observed
Presentation:       Cephalic
Placenta:           Posterior, above cervical os
P. Cord Insertion:  Visualized, central

Amniotic Fluid
AFI FV:      Subjectively within normal limits

Largest Pocket(cm)
4.8
Gestational Age

LMP:           18w 0d       Date:   07/21/16                 EDD:   04/27/17
Best:          18w 0d    Det. By:   LMP  (07/21/16)          EDD:   04/27/17
Cervix Uterus Adnexa

Cervix
Length:            1.1  cm.
Funneling of internal os noted. Cerclage visualized.

Uterus
No abnormality visualized.

Left Ovary
Size(cm)     2.02  x    2.47   x  2.22      Vol(ml):
Within normal limits. No adnexal mass visualized.

Right Ovary
Size(cm)     2.45  x    1.66   x  1.46      Vol(ml):
Within normal limits. No adnexal mass visualized.

Cul De Sac:   No free fluid seen.

Adnexa:       No abnormality visualized.
Impression

IUP at 18+0 weeks. short cervix with cerclage in situ. History
of preterm delivery
Transvaginal views show the cerclage placed in the middle
third of the cervix. There is funnelling to the level of the
cerclage, but not below it. There is 11mm of closed cervix
below the cerclage
Recommendations

Continue current treatment plan. Begin weekly cervical
evaluations

## 2018-04-08 ENCOUNTER — Ambulatory Visit: Payer: Self-pay | Admitting: Obstetrics & Gynecology

## 2018-04-29 ENCOUNTER — Encounter: Payer: Self-pay | Admitting: Obstetrics & Gynecology

## 2018-04-29 ENCOUNTER — Ambulatory Visit: Payer: Medicaid Other | Admitting: Obstetrics & Gynecology

## 2018-04-29 VITALS — BP 140/88

## 2018-04-29 DIAGNOSIS — Z3049 Encounter for surveillance of other contraceptives: Secondary | ICD-10-CM

## 2018-04-29 DIAGNOSIS — R638 Other symptoms and signs concerning food and fluid intake: Secondary | ICD-10-CM

## 2018-04-29 DIAGNOSIS — N938 Other specified abnormal uterine and vaginal bleeding: Secondary | ICD-10-CM

## 2018-04-29 DIAGNOSIS — Z3046 Encounter for surveillance of implantable subdermal contraceptive: Secondary | ICD-10-CM

## 2018-04-29 DIAGNOSIS — Z308 Encounter for other contraceptive management: Secondary | ICD-10-CM

## 2018-04-29 DIAGNOSIS — R635 Abnormal weight gain: Secondary | ICD-10-CM

## 2018-04-29 NOTE — Progress Notes (Signed)
Cassandra Russell 03/25/1991 [id number]        27 y.o.  [id number]  Single.  Sons 10 and 1 yo.  RP: Nexplanon removal  HPI: Nexplanon inserted in 04/2017.  Having frequent BTB and increased appetite/weight, would like to remove the Nexplanon for those reasons.  Currently abstinent.  Will use condoms as needed if sexually active.  No pelvic pain.  Normal vaginal secretions.   OB History  Gravida Para Term Preterm AB Living  2 2   2   2   SAB TAB Ectopic Multiple Live Births        0 2    # Outcome Date GA Lbr Len/2nd Weight Sex Delivery Anes PTL Lv  2 Preterm 03/05/17 2659w3d 04:25 / 00:39 4 lb 3.7 oz (1.92 kg) M Vag-Spont EPI  LIV  1 Preterm  3953w0d   M Vag-Spont  Y LIV    Past medical history,surgical history, problem list, medications, allergies, family history and social history were all reviewed and documented in the EPIC chart.   Directed ROS with pertinent positives and negatives documented in the history of present illness/assessment and plan.  Exam:  Vitals:   04/29/18 1130  BP: 140/88   General appearance:  Normal                                                             Nexplanon procedure note (removal)  The patient presented to the office today requesting for removal of her Nexplanon that was placed in the year 04/2017 on her left arm.   On examination the nexplanon implant was palpated and the distal end  (end  closest to the elbow) was marked. The area was sterilized with Betadine solution. 1% lidocaine was used for local anesthesia and approximately 1 cc  was injected into the site that was marked where the incision was to be made. The local anesthetic was injected under the implant in an effort to keep it  close to the skin surface. Slight pressure pushing downward was made at the proximal end  of the implant in an effort to stabilize it. A bulge appeared indicating the distal end of the implant. A small transverse incision of 2 mm was made at that location. By  gently pushing the implant toward the incision, the tip became visible. Grasping the implant with a curved forcep facilitated in gently removing the implant. Full confirmation of the entire implant which is 4 cm long was inspected and was intact and was shown to the patient and discarded. After removing the implant, the incision was closed with 3Steri-Strips, a band-aid and a bandage. Patient will be instructed to remove the pressure bandage in 24 hours, the band-aid in 3 days and the Steri-Strips in 7 days.   Assessment/Plan:  27 y.o. [id number]   1. Encounter for Nexplanon removal Easy Nexplanon removal at patient's request for side effects.  No complication.  Well-tolerated by the patient.  Patient is currently abstinent and declines any contraceptive alternatives.  Will use condoms if becomes sexually active recommend adding spermicides.  May also call back for alternative contraception at that time.  Patient will schedule annual gynecologic exam.  Counseling on above issues and coordination of care more than 50% for 15 minutes.  Genia DelMarie-Lyne Mayrin Schmuck MD, 11:45 AM  04/29/2018     

## 2018-05-01 ENCOUNTER — Encounter: Payer: Self-pay | Admitting: Obstetrics & Gynecology

## 2018-05-01 NOTE — Patient Instructions (Signed)
1. Encounter for Nexplanon removal Easy Nexplanon removal at patient's request for side effects.  No complication.  Well-tolerated by the patient.  Patient is currently abstinent and declines any contraceptive alternatives.  Will use condoms if becomes sexually active recommend adding spermicides.  May also call back for alternative contraception at that time.  Patient will schedule annual gynecologic exam.  Cassandra Russell, it was a pleasure meeting you today!

## 2018-08-09 ENCOUNTER — Ambulatory Visit (INDEPENDENT_AMBULATORY_CARE_PROVIDER_SITE_OTHER): Payer: Medicaid Other | Admitting: Obstetrics & Gynecology

## 2018-08-09 ENCOUNTER — Encounter: Payer: Self-pay | Admitting: Obstetrics & Gynecology

## 2018-08-09 VITALS — BP 126/84 | Ht 61.5 in | Wt 154.0 lb

## 2018-08-09 DIAGNOSIS — Z113 Encounter for screening for infections with a predominantly sexual mode of transmission: Secondary | ICD-10-CM | POA: Diagnosis not present

## 2018-08-09 DIAGNOSIS — Z23 Encounter for immunization: Secondary | ICD-10-CM

## 2018-08-09 DIAGNOSIS — Z30011 Encounter for initial prescription of contraceptive pills: Secondary | ICD-10-CM

## 2018-08-09 DIAGNOSIS — Z01419 Encounter for gynecological examination (general) (routine) without abnormal findings: Secondary | ICD-10-CM

## 2018-08-09 DIAGNOSIS — Z0000 Encounter for general adult medical examination without abnormal findings: Secondary | ICD-10-CM

## 2018-08-09 MED ORDER — NORETHIN ACE-ETH ESTRAD-FE 1-20 MG-MCG(24) PO TABS: 1.0000 | ORAL_TABLET | Freq: Every day | ORAL | 4 refills | Status: DC

## 2018-08-09 NOTE — Patient Instructions (Signed)
1. Encounter for routine gynecological examination with Papanicolaou smear of cervix Normal gynecologic exam.  Pap reflex done.  Breast exam normal.  Body mass index 28.63.  Recommend low calorie/carb diet such as [employer] and aerobic activities 5 times a week with weightlifting every 2 days.  2. Encounter for initial prescription of contraceptive pills Decision to start on birth control pill both for contraception and control of PCOS.  Usage risks and benefits reviewed with patient.  Norethindrone acetate ethynyl estradiol FE 1/20 24 sent to pharmacy.  3. Screen for STD (sexually transmitted disease) Condom use recommended. - Gono-Chlam on pap - HIV antibody (with reflex) - RPR - Hepatitis C Antibody - Hepatitis B Surface AntiGEN  Other orders - Norethindrone Acetate-Ethinyl Estrad-FE (LOESTRIN 24 FE) 1-20 MG-MCG(24) tablet; Take 1 tablet by mouth daily.  Cassandra Russell, it was a pleasure seeing you today!  I will inform you of your results as soon as they are available.

## 2018-08-09 NOTE — Progress Notes (Signed)
Cassandra Russell [birth year] [id number]   History:    27 y.o. G2P2L2 New boyfriend.  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  RP:  Established patient presenting for annual gyn exam   HPI: Oligomenorrhea, h/o PCOS.  Had a withdrawal bleeding after removal of Nexplanon 04/2018, then light menses x 4-5 days last week.  New boyfriend, using condoms for contraception, would like to start on BCPs.  Would like STI screen, but no pelvic pain, no vaginal discharge, no pain with IC.  Breasts wnl.  Urine/BMs wnl.  BMI 28.63.  Needs to increase fitness.  Past medical history,surgical history, family history and social history were all reviewed and documented in the EPIC chart.  Gynecologic History No LMP recorded. (Menstrual status: Irregular Periods). Contraception: condoms Last Pap: 09/2016. Results were: Negative.  Gono-Chlam neg. Last mammogram: Never Bone Density: Never Colonoscopy: Never  Obstetric History OB History  Gravida Para Term Preterm AB Living  2 2   2   2   SAB TAB Ectopic Multiple Live Births        0 2    # Outcome Date GA Lbr Len/2nd Weight Sex Delivery Anes PTL Lv  2 Preterm 03/05/17 [redacted]w[redacted]d 04:25 / 00:39 4 lb 3.7 oz (1.92 kg) M Vag-Spont EPI  LIV  1 Preterm  [redacted]w[redacted]d   M Vag-Spont  Y LIV     ROS: A ROS was performed and pertinent positives and negatives are included in the history.  GENERAL: No fevers or chills. HEENT: No change in vision, no earache, sore throat or sinus congestion. NECK: No pain or stiffness. CARDIOVASCULAR: No chest pain or pressure. No palpitations. PULMONARY: No shortness of breath, cough or wheeze. GASTROINTESTINAL: No abdominal pain, nausea, vomiting or diarrhea, melena or bright red blood per rectum. GENITOURINARY: No urinary frequency, urgency, hesitancy or dysuria. MUSCULOSKELETAL: No joint or muscle pain, no back pain, no recent trauma. DERMATOLOGIC: No rash, no itching, no lesions. ENDOCRINE: No polyuria, polydipsia, no heat or  cold intolerance. No recent change in weight. HEMATOLOGICAL: No anemia or easy bruising or bleeding. NEUROLOGIC: No headache, seizures, numbness, tingling or weakness. PSYCHIATRIC: No depression, no loss of interest in normal activity or change in sleep pattern.     Exam:   BP 126/84   Ht 5' 1.5" (1.562 m)   Wt 154 lb (69.9 kg)   BMI 28.63 kg/m   Body mass index is 28.63 kg/m.  General appearance : Well developed well nourished female. No acute distress HEENT: Eyes: no retinal hemorrhage or exudates,  Neck supple, trachea midline, no carotid bruits, no thyroidmegaly Lungs: Clear to auscultation, no rhonchi or wheezes, or rib retractions  Heart: Regular rate and rhythm, no murmurs or gallops Breast:Examined in sitting and supine position were symmetrical in appearance, no palpable masses or tenderness,  no skin retraction, no nipple inversion, no nipple discharge, no skin discoloration, no axillary or supraclavicular lymphadenopathy Abdomen: no palpable masses or tenderness, no rebound or guarding Extremities: no edema or skin discoloration or tenderness  Pelvic: Vulva: Normal             Vagina: No gross lesions or discharge  Cervix: No gross lesions or discharge.  Pap reflex/Gono-Chlam done  Uterus  AV, normal size, shape and consistency, non-tender and mobile  Adnexa  Without masses or tenderness  Anus: Normal   Assessment/Plan:  27 y.o. female for annual exam   1. Encounter for routine gynecological examination with Papanicolaou smear of cervix Normal  gynecologic exam.  Pap reflex done.  Breast exam normal.  Body mass index 28.63.  Recommend low calorie/carb diet such as [employer] and aerobic activities 5 times a week with weightlifting every 2 days.  2. Encounter for initial prescription of contraceptive pills Decision to start on birth control pill both for contraception and control of PCOS.  Usage risks and benefits reviewed with patient.  Norethindrone acetate  ethynyl estradiol FE 1/20 24 sent to pharmacy.  3. Screen for STD (sexually transmitted disease) Condom use recommended. - Gono-Chlam on pap - HIV antibody (with reflex) - RPR - Hepatitis C Antibody - Hepatitis B Surface AntiGEN  Other orders - Norethindrone Acetate-Ethinyl Estrad-FE (LOESTRIN 24 FE) 1-20 MG-MCG(24) tablet; Take 1 tablet by mouth daily.  Genia Del MD, 10:32 AM 08/09/2018

## 2018-08-11 LAB — HIV ANTIBODY (ROUTINE TESTING W REFLEX): HIV: NONREACTIVE

## 2018-08-11 LAB — HEPATITIS B SURFACE ANTIGEN: Hepatitis B Surface Ag: NONREACTIVE

## 2018-08-11 LAB — HEPATITIS C ANTIBODY
Hepatitis C Ab: NONREACTIVE
SIGNAL TO CUT-OFF: 0.01 (ref <1.00)

## 2018-08-11 LAB — RPR: RPR: NONREACTIVE

## 2018-08-13 LAB — PAP IG, CT-NG, RFX HPV ASCU
CHLAMYDIA, NUC. ACID AMP: NEGATIVE
Gonococcus by Nucleic Acid Amp: NEGATIVE
PAP SMEAR COMMENT: 0

## 2018-08-18 ENCOUNTER — Encounter: Payer: Medicaid Other | Admitting: Obstetrics & Gynecology

## 2018-08-26 ENCOUNTER — Ambulatory Visit: Payer: Medicaid Other | Admitting: Obstetrics & Gynecology

## 2018-08-26 ENCOUNTER — Encounter: Payer: Self-pay | Admitting: Obstetrics & Gynecology

## 2018-08-26 VITALS — BP 128/86

## 2018-08-26 DIAGNOSIS — N898 Other specified noninflammatory disorders of vagina: Secondary | ICD-10-CM | POA: Diagnosis not present

## 2018-08-26 DIAGNOSIS — R300 Dysuria: Secondary | ICD-10-CM | POA: Diagnosis not present

## 2018-08-26 LAB — WET PREP FOR TRICH, YEAST, CLUE

## 2018-08-26 MED ORDER — FLUCONAZOLE 150 MG PO TABS: 150.0000 mg | ORAL_TABLET | Freq: Every day | ORAL | 1 refills | Status: AC

## 2018-08-26 MED ORDER — CIPROFLOXACIN HCL 500 MG PO TABS: 500.0000 mg | ORAL_TABLET | Freq: Two times a day (BID) | ORAL | 0 refills | Status: AC

## 2018-08-26 NOTE — Progress Notes (Signed)
    Cassandra Russell Oct 12, 1991 [id number]        27 y.o.  G2P0202 Boyfriend  RP: Pain with urination and frequency with mild increase in vaginal discharge x a few days  HPI: Burning with micturition, frequency of urination and small amounts.  Mild improvement with Azo.  No fever.  C/O mild increase in vaginal D/C with some itching.  Full STI screen negative 08/09/2018.   OB History  Gravida Para Term Preterm AB Living  2 2   2   2   SAB TAB Ectopic Multiple Live Births        0 2    # Outcome Date GA Lbr Len/2nd Weight Sex Delivery Anes PTL Lv  2 Preterm 03/05/17 [redacted]w[redacted]d 04:25 / 00:39 4 lb 3.7 oz (1.92 kg) M Vag-Spont EPI  LIV  1 Preterm  [redacted]w[redacted]d   M Vag-Spont  Y LIV    Past medical history,surgical history, problem list, medications, allergies, family history and social history were all reviewed and documented in the EPIC chart.   Directed ROS with pertinent positives and negatives documented in the history of present illness/assessment and plan.  Exam:  Vitals:   08/26/18 1021  BP: 128/86   General appearance:  Normal  CVAT negative bilaterally  Gynecologic exam: Vulva normal.  Speculum:  Cervix/Vagina normal.  Mild increase in vaginal d/c.  Wet prep done.  U/A: Yellow clear, nitrites negative, white blood cells 0-5, red blood cells 3-10, few bacteria.  Urine culture pending.  Wet prep: Negative  Full STI screen negative 08/09/2018   Assessment/Plan:  27 y.o. [id number]   1. Dysuria Probable acute cystitis.  Decision to treat with ciprofloxacin.  Usage reviewed and prescription sent to pharmacy.  Pending urine culture.  Will take fluconazole to treat or prevent a yeast vaginitis after antibiotics. - Urinalysis,Complete w/RFL Culture  2. Vaginal discharge Wet prep negative, patient informed. - WET PREP FOR TRICH, YEAST, CLUE  Other orders - REFLEXIVE URINE CULTURE - ciprofloxacin (CIPRO) 500 MG tablet; Take 1 tablet (500 mg total) by mouth 2 (two) times daily  for 7 days. - fluconazole (DIFLUCAN) 150 MG tablet; Take 1 tablet (150 mg total) by mouth daily for 3 days.  Counseling on above issues and coordination of care more than 50% for 15 minutes.  Genia Del MD, 10:45 AM 08/26/2018

## 2018-08-26 NOTE — Patient Instructions (Signed)
1. Dysuria Probable acute cystitis.  Decision to treat with ciprofloxacin.  Usage reviewed and prescription sent to pharmacy.  Pending urine culture.  Will take fluconazole to treat or prevent a yeast vaginitis after antibiotics. - Urinalysis,Complete w/RFL Culture  2. Vaginal discharge Wet prep negative, patient informed. - WET PREP FOR TRICH, YEAST, CLUE  Other orders - REFLEXIVE URINE CULTURE - ciprofloxacin (CIPRO) 500 MG tablet; Take 1 tablet (500 mg total) by mouth 2 (two) times daily for 7 days. - fluconazole (DIFLUCAN) 150 MG tablet; Take 1 tablet (150 mg total) by mouth daily for 3 days.  Cassandra Russell, it was a pleasure seeing you today!  I will inform you of your results as soon as they are available.

## 2018-08-28 LAB — URINALYSIS, COMPLETE W/RFL CULTURE
Bilirubin Urine: NEGATIVE
GLUCOSE, UA: NEGATIVE
HYALINE CAST: NONE SEEN /LPF
Ketones, ur: NEGATIVE
Leukocyte Esterase: NEGATIVE
NITRITES URINE, INITIAL: NEGATIVE
PH: 5.5 (ref 5.0–8.0)
SPECIFIC GRAVITY, URINE: 1.025 (ref 1.001–1.03)

## 2018-08-28 LAB — URINE CULTURE
MICRO NUMBER: 91260111
SPECIMEN QUALITY:: ADEQUATE

## 2018-08-28 LAB — CULTURE INDICATED

## 2018-09-29 ENCOUNTER — Emergency Department (HOSPITAL_COMMUNITY)
Admission: EM | Admit: 2018-09-29 | Discharge: 2018-09-30 | Disposition: A | Discharge status: Home / Self Care | Payer: Medicaid Other | Attending: Emergency Medicine | Admitting: Emergency Medicine

## 2018-09-29 ENCOUNTER — Encounter (HOSPITAL_COMMUNITY): Payer: Self-pay

## 2018-09-29 ENCOUNTER — Emergency Department (HOSPITAL_COMMUNITY): Payer: Medicaid Other

## 2018-09-29 ENCOUNTER — Other Ambulatory Visit: Payer: Self-pay

## 2018-09-29 DIAGNOSIS — N83201 Unspecified ovarian cyst, right side: Secondary | ICD-10-CM

## 2018-09-29 DIAGNOSIS — N8301 Follicular cyst of right ovary: Secondary | ICD-10-CM | POA: Diagnosis not present

## 2018-09-29 DIAGNOSIS — Z79899 Other long term (current) drug therapy: Secondary | ICD-10-CM | POA: Diagnosis not present

## 2018-09-29 DIAGNOSIS — R1030 Lower abdominal pain, unspecified: Secondary | ICD-10-CM | POA: Diagnosis present

## 2018-09-29 DIAGNOSIS — R102 Pelvic and perineal pain: Secondary | ICD-10-CM | POA: Diagnosis not present

## 2018-09-29 LAB — COMPREHENSIVE METABOLIC PANEL
ALBUMIN: 4.3 g/dL (ref 3.5–5.0)
ALT: 14 U/L (ref 0–44)
AST: 20 U/L (ref 15–41)
Alkaline Phosphatase: 65 U/L (ref 38–126)
Anion gap: 10 (ref 5–15)
BILIRUBIN TOTAL: 1.6 mg/dL — AB (ref 0.3–1.2)
BUN: 9 mg/dL (ref 6–20)
CO2: 26 mmol/L (ref 22–32)
Calcium: 9.7 mg/dL (ref 8.9–10.3)
Chloride: 101 mmol/L (ref 98–111)
Creatinine, Ser: 0.77 mg/dL (ref 0.44–1.00)
GFR calc Af Amer: >60 mL/min (ref >60)
GFR calc non Af Amer: >60 mL/min (ref >60)
GLUCOSE: 84 mg/dL (ref 70–99)
POTASSIUM: 3.2 mmol/L — AB (ref 3.5–5.1)
Sodium: 137 mmol/L (ref 135–145)
TOTAL PROTEIN: 7.7 g/dL (ref 6.5–8.1)

## 2018-09-29 LAB — URINALYSIS, ROUTINE W REFLEX MICROSCOPIC
Bilirubin Urine: NEGATIVE
Glucose, UA: NEGATIVE mg/dL
Hgb urine dipstick: NEGATIVE
KETONES UR: NEGATIVE mg/dL
LEUKOCYTES UA: NEGATIVE
NITRITE: NEGATIVE
Protein, ur: NEGATIVE mg/dL
Specific Gravity, Urine: 1.026 (ref 1.005–1.030)
pH: 6 (ref 5.0–8.0)

## 2018-09-29 LAB — CBC WITH DIFFERENTIAL/PLATELET
ABS IMMATURE GRANULOCYTES: 0.05 10*3/uL (ref 0.00–0.07)
Basophils Absolute: 0 10*3/uL (ref 0.0–0.1)
Basophils Relative: 0 %
EOS ABS: 0.1 10*3/uL (ref 0.0–0.5)
Eosinophils Relative: 1 %
HEMATOCRIT: 44 % (ref 36.0–46.0)
HEMOGLOBIN: 14.2 g/dL (ref 12.0–15.0)
IMMATURE GRANULOCYTES: 0 %
LYMPHS ABS: 3.1 10*3/uL (ref 0.7–4.0)
LYMPHS PCT: 22 %
MCH: 29.3 pg (ref 26.0–34.0)
MCHC: 32.3 g/dL (ref 30.0–36.0)
MCV: 90.9 fL (ref 80.0–100.0)
MONOS PCT: 7 %
Monocytes Absolute: 0.9 10*3/uL (ref 0.1–1.0)
NEUTROS PCT: 70 %
Neutro Abs: 10 10*3/uL — ABNORMAL HIGH (ref 1.7–7.7)
Platelets: 346 10*3/uL (ref 150–400)
RBC: 4.84 MIL/uL (ref 3.87–5.11)
RDW: 11.7 % (ref 11.5–15.5)
WBC: 14.2 10*3/uL — ABNORMAL HIGH (ref 4.0–10.5)
nRBC: 0 % (ref 0.0–0.2)

## 2018-09-29 LAB — I-STAT BETA HCG BLOOD, ED (MC, WL, AP ONLY): I-stat hCG, quantitative: <5 m[IU]/mL (ref <5)

## 2018-09-29 LAB — WET PREP, GENITAL
Sperm: NONE SEEN
TRICH WET PREP: NONE SEEN
Yeast Wet Prep HPF POC: NONE SEEN

## 2018-09-29 LAB — LIPASE, BLOOD: Lipase: 36 U/L (ref 11–51)

## 2018-09-29 MED ORDER — KETOROLAC TROMETHAMINE 30 MG/ML IJ SOLN
30.0000 mg | Freq: Once | INTRAMUSCULAR | Status: DC
  Filled 2018-09-29: qty 1

## 2018-09-29 MED ORDER — IOHEXOL 300 MG/ML  SOLN
100.0000 mL | Freq: Once | INTRAMUSCULAR | Status: AC | PRN
  Administered 2018-09-29: 100 mL via INTRAVENOUS

## 2018-09-29 NOTE — ED Notes (Signed)
Pt in ultrasound at this time

## 2018-09-29 NOTE — ED Provider Notes (Signed)
[facility] EM[facility]ENC[facility] Provider Note   CSN: [id number] Arrival date & time: 09/29/18  1625     History   Chief Complaint Chief Complaint  Patient presents with  . Abdominal Pain    HPI Cassandra Russell is a 27 y.o. female history of chlamydia, PCOS, SVT who presents for evaluation of lower abdominal pain that began last night.  Patient reports that since last night, she has had a constant, cramping type of pain" to her lower abdomen.  She states that it feels like it wraps around the entire lower abdomen into her back but does state that the left is greater than right.  She states that she has felt slightly nauseous but no vomiting.  She has not wanted to eat or drink anything today secondary to pain.  Last bowel movement was last night and was normal with no presence of blood.  She reports that she has not taken any medication for the pain.  Patient states that she went to urgent care earlier today for evaluation of symptoms and was prompted to go the emergency department to rule out diverticulitis.  Patient does report she has a history of PCOS but states that this feels slightly different than when she has had ovarian cyst before.  She has not taken any medication for the pain.  She does report that on ED arrival, pain is slightly improved.  Patient denies any fevers, difficulty breathing, chest pain, dysuria, hematuria, vaginal bleeding, vaginal discharge, vomiting.  The history is provided by the patient.    Past Medical History:  Diagnosis Date  . Chlamydia   . Conjunctivitis   . DVT (deep vein thrombosis) in pregnancy 2018   arm  . History of chlamydia 04/13/2012  . PCOS (polycystic ovarian syndrome) 10/31/2014  . Preterm labor   . SVT (supraventricular tachycardia) (HCC) 10/2013   Adenosine was given in the ED ([facility]), pt was not admitted.    Patient Active Problem List   Diagnosis Date Noted  . Preterm premature rupture of membranes  (PPROM) with unknown onset of labor 03/04/2017  . Preterm labor 02/28/2017  . Occlusive thrombus 12/23/2016  . Cervical shortening affecting pregnancy in third trimester 12/15/2016  . H/O preterm delivery, currently pregnant, third trimester 12/15/2016  . Rh negative state in antepartum period, third trimester 12/15/2016  . Supervision of high risk pregnancy in third trimester 12/15/2016  . Cervical cerclage suture present in third trimester 12/08/2016  . History of cervical cerclage 12/08/2016  . History of supraventricular tachycardia 09/24/2015  . Dysplasia of cervix, low grade (CIN 1) 08/11/2012    Past Surgical History:  Procedure Laterality Date  . CERVICAL CERCLAGE N/A 11/11/2016   Procedure: CERCLAGE CERVICAL;  Surgeon: Levi AlandMark E Anderson, MD;  Location: [facility] ORS;  Service: Gynecology;  Laterality: N/A;  . CLOSED REDUCTION FINGER WITH PERCUTANEOUS PINNING Left 01/24/2014   Procedure: LEFT SMALL FINGER CLOSED REDUCTION WITH PINNING/POSSIBLE ORIF;  Surgeon: Sharma CovertFred W Ortmann, MD;  Location: [facility] OR;  Service: Orthopedics;  Laterality: Left;  . INTRAUTERINE DEVICE INSERTION  2009  . WISDOM TOOTH EXTRACTION       OB History    Gravida  2   Para  2   Term      Preterm  2   AB      Living  2     SAB      TAB      Ectopic      Multiple  0  Live Births  2            Home Medications    Prior to Admission medications   Medication Sig Start Date End Date Taking? Authorizing Provider  Norethindrone Acetate-Ethinyl Estrad-FE (LOESTRIN 24 FE) 1-20 MG-MCG(24) tablet Take 1 tablet by mouth daily. Patient not taking: Reported on 08/26/2018 08/09/18   Genia Del, MD    Family History Family History  Problem Relation Age of Onset  . Hypertension Mother     Social History Social History   Tobacco Use  . Smoking status: Never Smoker  . Smokeless tobacco: Never Used  Substance Use Topics  . Alcohol use: No    Alcohol/week: 0.0 standard drinks  . Drug use: No       Allergies   Patient has no known allergies.   Review of Systems Review of Systems  Constitutional: Negative for fever.  Respiratory: Negative for cough and shortness of breath.   Cardiovascular: Negative for chest pain.  Gastrointestinal: Positive for abdominal pain and nausea. Negative for blood in stool and vomiting.  Genitourinary: Negative for dysuria, hematuria, vaginal bleeding and vaginal discharge.  Neurological: Negative for headaches.  All other systems reviewed and are negative.    Physical Exam Updated Vital Signs BP (!) 144/83 (BP Location: Right Arm)   Pulse 100   Temp 98.9 F (37.2 C)   Resp 18   Ht 5\' 1"  (1.549 m)   Wt 70.8 kg   SpO2 100%   BMI 29.48 kg/m   Physical Exam  Constitutional: She is oriented to person, place, and time. She appears well-developed and well-nourished.  HENT:  Head: Normocephalic and atraumatic.  Mouth/Throat: Oropharynx is clear and moist and mucous membranes are normal.  Eyes: Pupils are equal, round, and reactive to light. Conjunctivae, EOM and lids are normal.  Neck: Full passive range of motion without pain.  Cardiovascular: Normal rate, regular rhythm, normal heart sounds and normal pulses. Exam reveals no gallop and no friction rub.  No murmur heard. Pulmonary/Chest: Effort normal and breath sounds normal.  Lungs clear to auscultation bilaterally.  Symmetric chest rise.  No wheezing, rales, rhonchi.  Abdominal: Soft. Normal appearance. There is tenderness in the right lower quadrant, suprapubic area and left lower quadrant. There is no rigidity, no guarding and no CVA tenderness.  Abdomen soft, nondistended.  Patient with tenderness noted to the lower abdomen, particular in the right lower quadrant in the left lower quadrant.  Left lower quadrant appears slightly worse than right.  Negative Rovsing signs.  No CVA tenderness bilaterally.  Genitourinary: Uterus normal. Cervix exhibits no motion tenderness. Right adnexum  displays tenderness. Right adnexum displays no mass. Left adnexum displays no mass and no tenderness. No vaginal discharge found.  Genitourinary Comments: The exam was performed with a chaperone present. Normal external female genitalia. No lesions, rash, or sores.  On exam, she has some mild white discharge in the vaginal vault.  Cervix is without any friability.  No CMT noted.  She does have right adnexal tenderness.  No evidence of mass.  No left adnexal mass or tenderness.  Musculoskeletal: Normal range of motion.  Neurological: She is alert and oriented to person, place, and time.  Skin: Skin is warm and dry. Capillary refill takes less than 2 seconds.  Psychiatric: She has a normal mood and affect. Her speech is normal.  Nursing note and vitals reviewed.    ED Treatments / Results  Labs (all labs ordered are listed, but only abnormal results  are displayed) Labs Reviewed  WET PREP, GENITAL - Abnormal; Notable for the following components:      Result Value   Clue Cells Wet Prep HPF POC PRESENT (*)    WBC, Wet Prep HPF POC MANY (*)    All other components within normal limits  COMPREHENSIVE METABOLIC PANEL - Abnormal; Notable for the following components:   Potassium 3.2 (*)    Total Bilirubin 1.6 (*)    All other components within normal limits  CBC WITH DIFFERENTIAL/PLATELET - Abnormal; Notable for the following components:   WBC 14.2 (*)    Neutro Abs 10.0 (*)    All other components within normal limits  LIPASE, BLOOD  URINALYSIS, ROUTINE W REFLEX MICROSCOPIC  I-STAT BETA HCG BLOOD, ED ([facility], [facility], [facility] ONLY)  GC/CHLAMYDIA PROBE AMP ([facility]) NOT AT [facility]    EKG None  Radiology Ct Abdomen Pelvis W Contrast  Result Date: 09/29/2018 CLINICAL DATA:  Lower abdominal pain.  Possible diverticulitis. EXAM: CT ABDOMEN AND PELVIS WITH CONTRAST TECHNIQUE: Multidetector CT imaging of the abdomen and pelvis was performed using the standard protocol following bolus administration of  intravenous contrast. CONTRAST:  OMNIPAQUE IOHEXOL 300 MG/ML  SOLN COMPARISON:  None. FINDINGS: Lower chest: Normal heart size. Lung bases are clear. No pleural effusion. Hepatobiliary: Liver is normal in size and contour. No focal lesions identified. Gallbladder is unremarkable. No intrahepatic or extrahepatic biliary ductal dilatation. Pancreas: Unremarkable Spleen: Unremarkable Adrenals/Urinary Tract: Normal adrenal glands. Kidneys enhance symmetrically with contrast. No hydronephrosis. Urinary bladder is unremarkable. Stomach/Bowel: Oral contrast material to the level of the rectum. No abnormal bowel wall thickening or evidence bowel obstruction. No free fluid or free intraperitoneal air. Normal morphology of the stomach. Vascular/Lymphatic: Normal caliber abdominal aorta. No retroperitoneal lymphadenopathy. Reproductive: Uterus is heterogeneous in enhancement. 3.2 x 2.5 cm right simple appearing adnexal cystic structure (image 59; series 3). Other: None. Musculoskeletal: No aggressive or acute appearing osseous lesions. IMPRESSION: No acute process within the abdomen or pelvis. Cystic lesion within the right adnexa. Left ovary is prominent in appearance. In the setting of pelvic pain, consider further evaluation of the uterus and adnexal structures with pelvic ultrasound. Electronically Signed   By: Annia Belt M.D.   On: 09/29/2018 20:19    Procedures Procedures (including critical care time)  Medications Ordered in ED Medications  ketorolac (TORADOL) 30 MG/ML injection 30 mg (has no administration in time range)  iohexol (OMNIPAQUE) 300 MG/ML solution 100 mL (100 mLs Intravenous Contrast Given 09/29/18 1941)     Initial Impression / Assessment and Plan / ED Course  I have reviewed the triage vital signs and the nursing notes.  Pertinent labs & imaging results that were available during my care of the patient were reviewed by me and considered in my medical decision making (see chart for  details).     27 year old female who presents for evaluation of lower abdominal pain that began last night.  She reports she was fine prior to onset of symptoms.  Associate with nausea.  No fevers, vomiting.  Reports that the pain has continued throughout the day.  She went to urgent care for evaluation and was told to come the emergency department for CT scan to rule out diverticulitis. Patient is afebrile, non-toxic appearing, sitting comfortably on examination table. Vital signs reviewed and stable.  On exam, patient has diffuse tenderness noted to the lower abdomen.  She is tender in both the right lower and left lower quadrant.  No CVA concern  for either infectious etiology versus UTI versus ovarian etiology.  We will plan to check basic labs, CT scan.  CBC shows leukocytosis of 14.2.  Lipase unremarkable.  I-STAT beta negative.  CMP shows potassium of 3.2.  Otherwise unremarkable.  UA negative for any acute infectious etiology.  CT scan shows no evidence of renal etiology.  No evidence of bowel wall thickening or obstruction.  Patient had some documented cystic lesion within the right adnexa.  Left ovary is prominent in appearance.  Exam as documented above.  Patient tolerated procedure well.  No CMT that would be concerning for PID today patient did have some tenderness to the right adnexa with no palpable mass.  Given that her pain started acutely and she has never had a history of right ovarian cyst, will plan for ultrasound to rule out any ovarian torsion or abscess.  Patient signed out to [facility], PA-C with U/S pending.  Please see note for further ED course.  Anticipate discharge if ultrasound is unremarkable.  Final Clinical Impressions(s) / ED Diagnoses   Final diagnoses:  Cyst of right ovary    ED Discharge Orders    None       Maxwell Caul, PA-C 09/29/18 2344    Gwyneth Sprout, MD 09/30/18 1110

## 2018-09-29 NOTE — Discharge Instructions (Addendum)
We have prescribed you naproxen to help with pain - Naproxen is a nonsteroidal anti-inflammatory medication that will help with pain and swelling. Be sure to take this medication as prescribed with food, 1 pill every 12 hours,  It should be taken with food, as it can cause stomach upset, and more seriously, stomach bleeding. Do not take other nonsteroidal anti-inflammatory medications with this such as Advil, Motrin, Aleve, Mobic, Goodie Powder, or Motrin.    You make take Tylenol per over the counter dosing with these medications.   We have prescribed you new medication(s) today. Discuss the medications prescribed today with your pharmacist as they can have adverse effects and interactions with your other medicines including over the counter and prescribed medications. Seek medical evaluation if you start to experience new or abnormal symptoms after taking one of these medicines, seek care immediately if you start to experience difficulty breathing, feeling of your throat closing, facial swelling, or rash as these could be indications of a more serious allergic reaction  Follow-up with your obgyn doctor within the next 3-5 days.   Return to the Emergency Department immediately if you experience any worsening abdominal pain, fever, persistent nausea and vomiting, inability keep any food down, pain with urination, blood in your urine or any other worsening or concerning symptoms.

## 2018-09-29 NOTE — ED Provider Notes (Signed)
23:00: Assumed care from Gwendalyn Ege PA-C at change of shift pending ultrasound.   Please see prior provider note for full H&P. Briefly patient is a 27 year old female with a hx of PCOS, prior ovarian cysts, and prior cervical cerclage who presents to the ED at request of UC for RLQ abdominal pain which started last night. Reports associated nausea without vomiting. Otherwise without associated sxs.    Physical Exam  BP (!) 144/83 (BP Location: Right Arm)   Pulse 100   Temp 98.9 F (37.2 C)   Resp 18   Ht 5\' 1"  (1.549 m)   Wt 70.8 kg   SpO2 100%   BMI 29.48 kg/m   Physical Exam  Constitutional: She appears well-developed and well-nourished. No distress.  HENT:  Head: Normocephalic and atraumatic.  Eyes: Conjunctivae are normal. Right eye exhibits no discharge. Left eye exhibits no discharge.  Abdominal: Soft. Normal appearance. She exhibits no distension. There is tenderness (mild) in the suprapubic area. There is no rigidity, no rebound and no guarding.  Neurological: She is alert.  Clear speech.   Psychiatric: She has a normal mood and affect. Her behavior is normal. Thought content normal.  Nursing note and vitals reviewed.   ED Course/Procedures    Results for orders placed or performed during the hospital encounter of 09/29/18  Wet prep, genital  Result Value Ref Range   Yeast Wet Prep HPF POC NONE SEEN NONE SEEN   Trich, Wet Prep NONE SEEN NONE SEEN   Clue Cells Wet Prep HPF POC PRESENT (A) NONE SEEN   WBC, Wet Prep HPF POC MANY (A) NONE SEEN   Sperm NONE SEEN   Comprehensive metabolic panel  Result Value Ref Range   Sodium 137 135 - 145 mmol/L   Potassium 3.2 (L) 3.5 - 5.1 mmol/L   Chloride 101 98 - 111 mmol/L   CO2 26 22 - 32 mmol/L   Glucose, Bld 84 70 - 99 mg/dL   BUN 9 6 - 20 mg/dL   Creatinine, Ser 1.61 0.44 - 1.00 mg/dL   Calcium 9.7 8.9 - 09.6 mg/dL   Total Protein 7.7 6.5 - 8.1 g/dL   Albumin 4.3 3.5 - 5.0 g/dL   AST 20 15 - 41 U/L   ALT 14 0 - 44  U/L   Alkaline Phosphatase 65 38 - 126 U/L   Total Bilirubin 1.6 (H) 0.3 - 1.2 mg/dL   GFR calc non Af Amer >60 >60 mL/min   GFR calc Af Amer >60 >60 mL/min   Anion gap 10 5 - 15  Lipase, blood  Result Value Ref Range   Lipase 36 11 - 51 U/L  CBC with Differential  Result Value Ref Range   WBC 14.2 (H) 4.0 - 10.5 K/uL   RBC 4.84 3.87 - 5.11 MIL/uL   Hemoglobin 14.2 12.0 - 15.0 g/dL   HCT 04.5 40.9 - 81.1 %   MCV 90.9 80.0 - 100.0 fL   MCH 29.3 26.0 - 34.0 pg   MCHC 32.3 30.0 - 36.0 g/dL   RDW 91.4 78.2 - 95.6 %   Platelets 346 150 - 400 K/uL   nRBC 0.0 0.0 - 0.2 %   Neutrophils Relative % 70 %   Neutro Abs 10.0 (H) 1.7 - 7.7 K/uL   Lymphocytes Relative 22 %   Lymphs Abs 3.1 0.7 - 4.0 K/uL   Monocytes Relative 7 %   Monocytes Absolute 0.9 0.1 - 1.0 K/uL   Eosinophils Relative  1 %   Eosinophils Absolute 0.1 0.0 - 0.5 K/uL   Basophils Relative 0 %   Basophils Absolute 0.0 0.0 - 0.1 K/uL   Immature Granulocytes 0 %   Abs Immature Granulocytes 0.05 0.00 - 0.07 K/uL  Urinalysis, Routine w reflex microscopic  Result Value Ref Range   Color, Urine YELLOW YELLOW   APPearance CLEAR CLEAR   Specific Gravity, Urine 1.026 1.005 - 1.030   pH 6.0 5.0 - 8.0   Glucose, UA NEGATIVE NEGATIVE mg/dL   Hgb urine dipstick NEGATIVE NEGATIVE   Bilirubin Urine NEGATIVE NEGATIVE   Ketones, ur NEGATIVE NEGATIVE mg/dL   Protein, ur NEGATIVE NEGATIVE mg/dL   Nitrite NEGATIVE NEGATIVE   Leukocytes, UA NEGATIVE NEGATIVE  I-Stat beta hCG blood, ED  Result Value Ref Range   I-stat hCG, quantitative <5.0 <5 mIU/mL   Comment 3           US Transvaginal Non-ob  Result Date: 09/30/2018 CLINICAL DATA:  27 y/o  F; right lower quadrant abdominal pain. EXAM: TRANSABDOMINAL AND TRANSVAGINAL ULTRASOUND OF PELVIS DOPPLER ULTRASOUND OF OVARIES TECHNIQUE: Both transabdominal and transvaginal ultrasound examinations of the pelvis were performed. Transabdominal technique was performed for global imaging of the  pelvis including uterus, ovaries, adnexal regions, and pelvic cul-de-sac. It was necessary to proceed with endovaginal exam following the transabdominal exam to visualize the uterus, endometrium, and adnexa. Color and duplex Doppler ultrasound was utilized to evaluate blood flow to the ovaries. COMPARISON:  09/29/2018 CT abdomen and pelvis FINDINGS: Uterus Measurements: 6.0 x 3.7 x 3.8 cm = volume: 43.8 mL. No fibroids or other mass visualized. Endometrium Thickness: 5.8 mm.  No focal abnormality visualized. Right ovary Measurements: 3.2 x 2.4 x 2.8 cm = volume: 11.2 mL. Normal appearance/no adnexal mass. Simple follicles noted. Left ovary Measurements: 3.4 x 1.5 x 2.5 cm = volume: 6.6 mL. Normal appearance/no adnexal mass. Simple follicles noted. Pulsed Doppler evaluation of both ovaries demonstrates normal low-resistance arterial and venous waveforms. Other findings Trace volume of simple free fluid, likely physiologic. IMPRESSION: No acute process identified.  Unremarkable pelvic ultrasound. Electronically Signed   By: Mitzi Hansen M.D.   On: 09/30/2018 00:30   US Pelvis Complete  Result Date: 09/30/2018 CLINICAL DATA:  27 y/o  F; right lower quadrant abdominal pain. EXAM: TRANSABDOMINAL AND TRANSVAGINAL ULTRASOUND OF PELVIS DOPPLER ULTRASOUND OF OVARIES TECHNIQUE: Both transabdominal and transvaginal ultrasound examinations of the pelvis were performed. Transabdominal technique was performed for global imaging of the pelvis including uterus, ovaries, adnexal regions, and pelvic cul-de-sac. It was necessary to proceed with endovaginal exam following the transabdominal exam to visualize the uterus, endometrium, and adnexa. Color and duplex Doppler ultrasound was utilized to evaluate blood flow to the ovaries. COMPARISON:  09/29/2018 CT abdomen and pelvis FINDINGS: Uterus Measurements: 6.0 x 3.7 x 3.8 cm = volume: 43.8 mL. No fibroids or other mass visualized. Endometrium Thickness: 5.8 mm.  No  focal abnormality visualized. Right ovary Measurements: 3.2 x 2.4 x 2.8 cm = volume: 11.2 mL. Normal appearance/no adnexal mass. Simple follicles noted. Left ovary Measurements: 3.4 x 1.5 x 2.5 cm = volume: 6.6 mL. Normal appearance/no adnexal mass. Simple follicles noted. Pulsed Doppler evaluation of both ovaries demonstrates normal low-resistance arterial and venous waveforms. Other findings Trace volume of simple free fluid, likely physiologic. IMPRESSION: No acute process identified.  Unremarkable pelvic ultrasound. Electronically Signed   By: Mitzi Hansen M.D.   On: 09/30/2018 00:30   Ct Abdomen Pelvis W Contrast  Result Date:  09/29/2018 CLINICAL DATA:  Lower abdominal pain.  Possible diverticulitis. EXAM: CT ABDOMEN AND PELVIS WITH CONTRAST TECHNIQUE: Multidetector CT imaging of the abdomen and pelvis was performed using the standard protocol following bolus administration of intravenous contrast. CONTRAST:  OMNIPAQUE IOHEXOL 300 MG/ML  SOLN COMPARISON:  None. FINDINGS: Lower chest: Normal heart size. Lung bases are clear. No pleural effusion. Hepatobiliary: Liver is normal in size and contour. No focal lesions identified. Gallbladder is unremarkable. No intrahepatic or extrahepatic biliary ductal dilatation. Pancreas: Unremarkable Spleen: Unremarkable Adrenals/Urinary Tract: Normal adrenal glands. Kidneys enhance symmetrically with contrast. No hydronephrosis. Urinary bladder is unremarkable. Stomach/Bowel: Oral contrast material to the level of the rectum. No abnormal bowel wall thickening or evidence bowel obstruction. No free fluid or free intraperitoneal air. Normal morphology of the stomach. Vascular/Lymphatic: Normal caliber abdominal aorta. No retroperitoneal lymphadenopathy. Reproductive: Uterus is heterogeneous in enhancement. 3.2 x 2.5 cm right simple appearing adnexal cystic structure (image 59; series 3). Other: None. Musculoskeletal: No aggressive or acute appearing osseous  lesions. IMPRESSION: No acute process within the abdomen or pelvis. Cystic lesion within the right adnexa. Left ovary is prominent in appearance. In the setting of pelvic pain, consider further evaluation of the uterus and adnexal structures with pelvic ultrasound. Electronically Signed   By: Annia Belt M.D.   On: 09/29/2018 20:19   [place] Art/ven Flow Abd Pelv Doppler  Result Date: 09/30/2018 CLINICAL DATA:  27 y/o  F; right lower quadrant abdominal pain. EXAM: TRANSABDOMINAL AND TRANSVAGINAL ULTRASOUND OF PELVIS DOPPLER ULTRASOUND OF OVARIES TECHNIQUE: Both transabdominal and transvaginal ultrasound examinations of the pelvis were performed. Transabdominal technique was performed for global imaging of the pelvis including uterus, ovaries, adnexal regions, and pelvic cul-de-sac. It was necessary to proceed with endovaginal exam following the transabdominal exam to visualize the uterus, endometrium, and adnexa. Color and duplex Doppler ultrasound was utilized to evaluate blood flow to the ovaries. COMPARISON:  09/29/2018 CT abdomen and pelvis FINDINGS: Uterus Measurements: 6.0 x 3.7 x 3.8 cm = volume: 43.8 mL. No fibroids or other mass visualized. Endometrium Thickness: 5.8 mm.  No focal abnormality visualized. Right ovary Measurements: 3.2 x 2.4 x 2.8 cm = volume: 11.2 mL. Normal appearance/no adnexal mass. Simple follicles noted. Left ovary Measurements: 3.4 x 1.5 x 2.5 cm = volume: 6.6 mL. Normal appearance/no adnexal mass. Simple follicles noted. Pulsed Doppler evaluation of both ovaries demonstrates normal low-resistance arterial and venous waveforms. Other findings Trace volume of simple free fluid, likely physiologic. IMPRESSION: No acute process identified.  Unremarkable pelvic ultrasound. Electronically Signed   By: Mitzi Hansen M.D.   On: 09/30/2018 00:30    Procedures  MDM   Patient presents to the emergency department with right lower quadrant abdominal pain with associated nausea.   Work-up reviewed and fairly unremarkable, she does have notable leukocytosis at 14.2 of unclear etiology.  Mild hypokalemia at 3.2.  No evidence of UTI.  LFTs and lipase are within normal limits.  Initial imaging included CT abdomen pelvis with contrast, no acute process within the abdomen or pelvis was identified, there was a cystic lesion within the right adnexa-pelvic subsequently performed by previous provider notable for right adnexal tenderness, not seem consistent with PID per her exam.  Care signed out to me at change of shift pending ultrasound report- without significant findings plan for discharge home with naproxen and obgyn follow up.   Ultrasound unremarkable. Discharge per prior provider. I discussed results, treatment plan, need for follow-up, and return precautions with the patient. Provided  opportunity for questions, patient confirmed understanding and is in agreement with plan.      Cherly Andersonetrucelli, Jaqualyn Juday R, PA-C 09/30/18 0057    Benjiman CorePickering, Nathan, MD 09/30/18 1535

## 2018-09-29 NOTE — ED Triage Notes (Signed)
Pt BIB POV for eval of blt LQ abd pain L>R. Pt endorses nausea, no vomiting, soft stools. Denies melena. Pt reports she was sent here from UC for eval of possible diverticulitis.

## 2018-09-30 LAB — GC/CHLAMYDIA PROBE AMP (~~LOC~~) NOT AT ARMC
CHLAMYDIA, DNA PROBE: NEGATIVE
NEISSERIA GONORRHEA: NEGATIVE

## 2018-09-30 MED ORDER — NAPROXEN 500 MG PO TABS: 500.0000 mg | ORAL_TABLET | Freq: Two times a day (BID) | ORAL | 0 refills | Status: DC

## 2018-10-04 NOTE — Telephone Encounter (Signed)
Dr. Seymour BarsLavoie- 11.21.19 patient was seen in ER and wet prep reported as:  Results for orders placed or performed during the hospital encounter of 09/29/18  Wet prep, genital  Result Value Ref Range   Yeast Wet Prep HPF POC NONE SEEN NONE SEEN   Trich, Wet Prep NONE SEEN NONE SEEN   Clue Cells Wet Prep HPF POC PRESENT (A) NONE SEEN   WBC, Wet Prep HPF POC MANY (A) NONE SEEN   Sperm NONE SEEN

## 2018-11-17 ENCOUNTER — Ambulatory Visit: Payer: Medicaid Other | Admitting: Obstetrics & Gynecology

## 2018-11-17 ENCOUNTER — Encounter: Payer: Self-pay | Admitting: Obstetrics & Gynecology

## 2018-11-17 VITALS — BP 128/84

## 2018-11-17 DIAGNOSIS — N766 Ulceration of vulva: Secondary | ICD-10-CM

## 2018-11-17 DIAGNOSIS — E282 Polycystic ovarian syndrome: Secondary | ICD-10-CM

## 2018-11-17 DIAGNOSIS — Z113 Encounter for screening for infections with a predominantly sexual mode of transmission: Secondary | ICD-10-CM

## 2018-11-17 DIAGNOSIS — N898 Other specified noninflammatory disorders of vagina: Secondary | ICD-10-CM

## 2018-11-17 LAB — WET PREP FOR TRICH, YEAST, CLUE

## 2018-11-17 LAB — PREGNANCY, URINE: Preg Test, Ur: NEGATIVE

## 2018-11-17 MED ORDER — FLUCONAZOLE 150 MG PO TABS: 150.0000 mg | ORAL_TABLET | Freq: Every day | ORAL | 1 refills | Status: AC

## 2018-11-17 MED ORDER — TINIDAZOLE 500 MG PO TABS: 2.0000 g | ORAL_TABLET | Freq: Every day | ORAL | 1 refills | Status: AC

## 2018-11-17 MED ORDER — VALACYCLOVIR HCL 1 G PO TABS: 1000.0000 mg | ORAL_TABLET | Freq: Every day | ORAL | 0 refills | Status: AC

## 2018-11-17 MED ORDER — NORETHIN ACE-ETH ESTRAD-FE 1-20 MG-MCG(24) PO TABS: 1.0000 | ORAL_TABLET | Freq: Every day | ORAL | 4 refills | Status: DC

## 2018-11-17 NOTE — Progress Notes (Signed)
    Cassandra Russell 1991/09/20 [id number]        28 y.o.  G2P2L2 Single  RP: Vaginal d/c with odor x 3 weeks and left vulvar painful ulcer  HPI: Broke up with boyfriend early 09/2018.  Abstinent since then.  Complains of increased vaginal discharge with odor for 3 weeks and a left painful vulvar ulcer.  No previous vulvar ulcers.  No previous diagnosis of genital herpes.  No pelvic pain.   OB History  Gravida Para Term Preterm AB Living  2 2   2   2   SAB TAB Ectopic Multiple Live Births        0 2    # Outcome Date GA Lbr Len/2nd Weight Sex Delivery Anes PTL Lv  2 Preterm 03/05/17 [redacted]w[redacted]d 04:25 / 00:39 4 lb 3.7 oz (1.92 kg) M Vag-Spont EPI  LIV  1 Preterm  [redacted]w[redacted]d   M Vag-Spont  Y LIV    Past medical history,surgical history, problem list, medications, allergies, family history and social history were all reviewed and documented in the EPIC chart.   Directed ROS with pertinent positives and negatives documented in the history of present illness/assessment and plan.  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  Sure swab for HSV done.  Speculum: Increased vaginal discharge.  Normal cervix and vagina.  Wet prep and gonorrhea chlamydia done.  Bimanual exam: Anteverted uterus, mobile, nontender.  No adnexal mass, nontender.  Wet prep: Clue cells present.   Assessment/Plan:  28 y.o. [id number]   1. Vaginal discharge Bacterial vaginosis clinically and confirmed with wet prep.  Will treat with tinidazole.  Usage reviewed and prescription sent to pharmacy.  Will take fluconazole after antibiotic treatment to prevent or treat yeast vaginitis.  Usage reviewed and prescription sent to pharmacy. - WET PREP FOR TRICH, YEAST, CLUE  2. Vaginal odor As above. - WET PREP FOR TRICH, YEAST, CLUE  3. 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  Sure swab done as well as HSV 1 and 2 IgG's.  Will treat with valacyclovir 1 g daily for 5 days.  Usage reviewed and prescription sent to pharmacy.  4. Screen for STD (sexually transmitted disease) Recommend strict condom use. - Gono-Chlam - HIV antibody (with reflex) - RPR - Hepatitis C Antibody - Hepatitis B Surface AntiGEN - HSV(herpes simplex vrs) 1+2 ab-IgG - SureSwab HSV, Type 1/2 DNA, PCR  5. PCOS (polycystic ovarian syndrome) Urine pregnancy test negative.  Restart on birth control pill with the generic of Loestrin 24 FE 1/20.  Usage reviewed with patient, low risk of increasing blood pressure and risk of blood clots previously reviewed with patient.  Prescription sent to pharmacy. - Pregnancy, urine  Other orders - tinidazole (TINDAMAX) 500 MG tablet; Take 4 tablets (2,000 mg total) by mouth daily for 2 days. - fluconazole (DIFLUCAN) 150 MG tablet; Take 1 tablet (150 mg total) by mouth daily for 3 days. - valACYclovir (VALTREX) 1000 MG tablet; Take 1 tablet (1,000 mg total) by mouth daily for 5 days. - Norethindrone Acetate-Ethinyl Estrad-FE (LOESTRIN 24 FE) 1-20 MG-MCG(24) tablet; Take 1 tablet by mouth daily.  Counseling on above issues and coordination of care more than 50% for 25 minutes.  Genia Del MD, 9:37 AM 11/17/2018

## 2018-11-18 LAB — HEPATITIS B SURFACE ANTIGEN: Hepatitis B Surface Ag: NONREACTIVE

## 2018-11-18 LAB — HSV(HERPES SIMPLEX VRS) I + II AB-IGG: HSV 2 IGG, TYPE SPEC: 12.1 {index} — AB

## 2018-11-18 LAB — HEPATITIS C ANTIBODY
Hepatitis C Ab: NONREACTIVE
SIGNAL TO CUT-OFF: 0.03 (ref <1.00)

## 2018-11-18 LAB — HIV ANTIBODY (ROUTINE TESTING W REFLEX): HIV 1&2 Ab, 4th Generation: NONREACTIVE

## 2018-11-18 LAB — RPR: RPR Ser Ql: NONREACTIVE

## 2018-11-21 LAB — SURESWAB HSV, TYPE 1/2 DNA, PCR
HSV 1 DNA: NOT DETECTED
HSV 2 DNA: DETECTED — AB

## 2018-11-27 ENCOUNTER — Encounter: Payer: Self-pay | Admitting: Obstetrics & Gynecology

## 2018-11-27 NOTE — Patient Instructions (Signed)
1. Vaginal discharge Bacterial vaginosis clinically and confirmed with wet prep.  Will treat with tinidazole.  Usage reviewed and prescription sent to pharmacy.  Will take fluconazole after antibiotic treatment to prevent or treat yeast vaginitis.  Usage reviewed and prescription sent to pharmacy. - WET PREP FOR TRICH, YEAST, CLUE  2. Vaginal odor As above. - WET PREP FOR TRICH, YEAST, CLUE  3. Vulvar ulcer Vulvar ulcer on the left, most likely first episode of genital herpes.  Sure swab done as well as HSV 1 and 2 IgG's.  Will treat with valacyclovir 1 g daily for 5 days.  Usage reviewed and prescription sent to pharmacy.  4. Screen for STD (sexually transmitted disease) Recommend strict condom use. - Gono-Chlam - HIV antibody (with reflex) - RPR - Hepatitis C Antibody - Hepatitis B Surface AntiGEN - HSV(herpes simplex vrs) 1+2 ab-IgG - SureSwab HSV, Type 1/2 DNA, PCR  5. PCOS (polycystic ovarian syndrome) Urine pregnancy test negative.  Restart on birth control pill with the generic of Loestrin 24 FE 1/20.  Usage reviewed with patient, low risk of increasing blood pressure and risk of blood clots previously reviewed with patient.  Prescription sent to pharmacy. - Pregnancy, urine  Other orders - tinidazole (TINDAMAX) 500 MG tablet; Take 4 tablets (2,000 mg total) by mouth daily for 2 days. - fluconazole (DIFLUCAN) 150 MG tablet; Take 1 tablet (150 mg total) by mouth daily for 3 days. - valACYclovir (VALTREX) 1000 MG tablet; Take 1 tablet (1,000 mg total) by mouth daily for 5 days. - Norethindrone Acetate-Ethinyl Estrad-FE (LOESTRIN 24 FE) 1-20 MG-MCG(24) tablet; Take 1 tablet by mouth daily.  Cassandra Russell, it was a pleasure seeing you today!  I will inform you of your results as soon as they are available.

## 2019-07-31 ENCOUNTER — Encounter: Payer: Self-pay | Admitting: Gynecology

## 2019-11-30 ENCOUNTER — Ambulatory Visit: Payer: Medicaid Other | Attending: Internal Medicine

## 2019-11-30 ENCOUNTER — Other Ambulatory Visit: Payer: Self-pay

## 2019-11-30 DIAGNOSIS — Z20822 Contact with and (suspected) exposure to covid-19: Secondary | ICD-10-CM

## 2019-12-01 LAB — NOVEL CORONAVIRUS, NAA: SARS-CoV-2, NAA: NOT DETECTED

## 2020-06-25 ENCOUNTER — Other Ambulatory Visit: Payer: Medicaid Other

## 2020-06-25 ENCOUNTER — Other Ambulatory Visit: Payer: Self-pay

## 2020-06-25 DIAGNOSIS — Z20822 Contact with and (suspected) exposure to covid-19: Secondary | ICD-10-CM

## 2020-06-26 LAB — NOVEL CORONAVIRUS, NAA: SARS-CoV-2, NAA: NOT DETECTED

## 2020-06-26 LAB — SARS-COV-2, NAA 2 DAY TAT

## 2020-10-15 ENCOUNTER — Other Ambulatory Visit: Payer: Medicaid Other

## 2020-10-15 DIAGNOSIS — Z20822 Contact with and (suspected) exposure to covid-19: Secondary | ICD-10-CM

## 2020-10-16 LAB — NOVEL CORONAVIRUS, NAA: SARS-CoV-2, NAA: NOT DETECTED

## 2020-10-16 LAB — SARS-COV-2, NAA 2 DAY TAT

## 2020-10-21 ENCOUNTER — Other Ambulatory Visit: Payer: Medicaid Other

## 2020-10-22 ENCOUNTER — Other Ambulatory Visit: Payer: Medicaid Other

## 2020-10-22 DIAGNOSIS — Z20822 Contact with and (suspected) exposure to covid-19: Secondary | ICD-10-CM

## 2020-10-23 LAB — NOVEL CORONAVIRUS, NAA: SARS-CoV-2, NAA: NOT DETECTED

## 2020-10-23 LAB — SARS-COV-2, NAA 2 DAY TAT

## 2020-11-28 ENCOUNTER — Ambulatory Visit: Payer: Medicaid Other | Attending: Internal Medicine

## 2020-11-28 DIAGNOSIS — Z23 Encounter for immunization: Secondary | ICD-10-CM

## 2020-11-28 NOTE — Progress Notes (Signed)
   Covid-19 Vaccination Clinic  Name:  Cassandra Russell    MRN: [id number] DOB: Apr 10, 1991  11/28/2020  [name] was observed post Covid-19 immunization for 15 minutes without incident. She was provided with Vaccine Information Sheet and instruction to access the V-Safe system.   [name] was instructed to call 911 with any severe reactions post vaccine: Marland Kitchen Difficulty breathing  . Swelling of face and throat  . A fast heartbeat  . A bad rash all over body  . Dizziness and weakness   Immunizations Administered    Name Date Dose VIS Date Route   Pfizer COVID-19 Vaccine 11/28/2020  2:25 PM 0.3 mL 08/28/2020 Intramuscular   Manufacturer: [employer], [employer]   Lot: [id number]   NDC: [address]-5

## 2020-12-19 ENCOUNTER — Other Ambulatory Visit: Payer: Medicaid Other

## 2020-12-19 ENCOUNTER — Ambulatory Visit: Payer: Medicaid Other | Attending: Internal Medicine

## 2020-12-19 DIAGNOSIS — Z23 Encounter for immunization: Secondary | ICD-10-CM

## 2020-12-19 DIAGNOSIS — Z20822 Contact with and (suspected) exposure to covid-19: Secondary | ICD-10-CM

## 2020-12-19 NOTE — Progress Notes (Signed)
   Covid-19 Vaccination Clinic  Name:  Cassandra Russell    MRN: [id number] DOB: July 23, 1991  12/19/2020  Cassandra Russell was observed post Covid-19 immunization for 15 minutes without incident. She was provided with Vaccine Information Sheet and instruction to access the V-Safe system.   Cassandra Russell was instructed to call 911 with any severe reactions post vaccine: Marland Kitchen Difficulty breathing  . Swelling of face and throat  . A fast heartbeat  . A bad rash all over body  . Dizziness and weakness   Immunizations Administered    Name Date Dose VIS Date Route   PFIZER Comrnaty(Gray TOP) Covid-19 Vaccine 12/19/2020  2:04 PM 0.3 mL 10/17/2020 Intramuscular   Manufacturer: [employer], [employer]   Lot: [id number]   NDC: [phone]

## 2020-12-20 LAB — NOVEL CORONAVIRUS, NAA: SARS-CoV-2, NAA: NOT DETECTED

## 2020-12-20 LAB — SARS-COV-2, NAA 2 DAY TAT

## 2020-12-24 DIAGNOSIS — Z20822 Contact with and (suspected) exposure to covid-19: Secondary | ICD-10-CM | POA: Diagnosis not present

## 2021-08-01 ENCOUNTER — Telehealth: Payer: Medicaid Other | Admitting: Physician Assistant

## 2021-08-01 DIAGNOSIS — R3989 Other symptoms and signs involving the genitourinary system: Secondary | ICD-10-CM | POA: Diagnosis not present

## 2021-08-01 MED ORDER — NITROFURANTOIN MONOHYD MACRO 100 MG PO CAPS: 100.0000 mg | ORAL_CAPSULE | Freq: Two times a day (BID) | ORAL | 0 refills | Status: DC

## 2021-08-01 NOTE — Patient Instructions (Signed)
Cassandra Russell, thank you for joining Margaretann Loveless, PA-C for today's virtual visit.  While this provider is not your primary care provider (PCP), if your PCP is located in our provider database this encounter information will be shared with them immediately following your visit.  Consent: (Patient) Cassandra Russell provided verbal consent for this virtual visit at the beginning of the encounter.  Current Medications:  Current Outpatient Medications:    nitrofurantoin, macrocrystal-monohydrate, (MACROBID) 100 MG capsule, Take 1 capsule (100 mg total) by mouth 2 (two) times daily., Disp: 10 capsule, Rfl: 0   Norethindrone Acetate-Ethinyl Estrad-FE (LOESTRIN 24 FE) 1-20 MG-MCG(24) tablet, Take 1 tablet by mouth daily., Disp: 3 Package, Rfl: 4   Medications ordered in this encounter:  Meds ordered this encounter  Medications   nitrofurantoin, macrocrystal-monohydrate, (MACROBID) 100 MG capsule    Sig: Take 1 capsule (100 mg total) by mouth 2 (two) times daily.    Dispense:  10 capsule    Refill:  0    Order Specific Question:   Supervising Provider    Answer:   Hyacinth Meeker, BRIAN [[id number]]     *If you need refills on other medications prior to your next appointment, please contact your pharmacy*  Follow-Up: Call back or seek an in-person evaluation if the symptoms worsen or if the condition fails to improve as anticipated.  Other Instructions Urinary Tract Infection, Adult A urinary tract infection (UTI) is an infection of any part of the urinary tract. The urinary tract includes: The kidneys. The ureters. The bladder. The urethra. These organs make, store, and get rid of pee (urine) in the body. What are the causes? This infection is caused by germs (bacteria) in your genital area. These germs grow and cause swelling (inflammation) of your urinary tract. What increases the risk? The following factors may make you more likely to develop this condition: Using a small,  thin tube (catheter) to drain pee. Not being able to control when you pee or poop (incontinence). Being female. If you are female, these things can increase the risk: Using these methods to prevent pregnancy: A medicine that kills sperm (spermicide). A device that blocks sperm (diaphragm). Having low levels of a female hormone (estrogen). Being pregnant. You are more likely to develop this condition if: You have genes that add to your risk. You are sexually active. You take antibiotic medicines. You have trouble peeing because of: A prostate that is bigger than normal, if you are female. A blockage in the part of your body that drains pee from the bladder. A kidney stone. A nerve condition that affects your bladder. Not getting enough to drink. Not peeing often enough. You have other conditions, such as: Diabetes. A weak disease-fighting system (immune system). Sickle cell disease. Gout. Injury of the spine. What are the signs or symptoms? Symptoms of this condition include: Needing to pee right away. Peeing small amounts often. Pain or burning when peeing. Blood in the pee. Pee that smells bad or not like normal. Trouble peeing. Pee that is cloudy. Fluid coming from the vagina, if you are female. Pain in the belly or lower back. Other symptoms include: Vomiting. Not feeling hungry. Feeling mixed up (confused). This may be the first symptom in older adults. Being tired and grouchy (irritable). A fever. Watery poop (diarrhea). How is this treated? Taking antibiotic medicine. Taking other medicines. Drinking enough water. In some cases, you may need to see a specialist. Follow these instructions at home: Medicines Take over-the-counter and  prescription medicines only as told by your doctor. If you were prescribed an antibiotic medicine, take it as told by your doctor. Do not stop taking it even if you start to feel better. General instructions Make sure you: Pee  until your bladder is empty. Do not hold pee for a long time. Empty your bladder after sex. Wipe from front to back after peeing or pooping if you are a female. Use each tissue one time when you wipe. Drink enough fluid to keep your pee pale yellow. Keep all follow-up visits. Contact a doctor if: You do not get better after 1-2 days. Your symptoms go away and then come back. Get help right away if: You have very bad back pain. You have very bad pain in your lower belly. You have a fever. You have chills. You feeling like you will vomit or you vomit. Summary A urinary tract infection (UTI) is an infection of any part of the urinary tract. This condition is caused by germs in your genital area. There are many risk factors for a UTI. Treatment includes antibiotic medicines. Drink enough fluid to keep your pee pale yellow. This information is not intended to replace advice given to you by your health care provider. Make sure you discuss any questions you have with your health care provider. Document Revised: 06/07/2020 Document Reviewed: 06/07/2020 Elsevier Patient Education  2022 [employer].    If you have been instructed to have an in-person evaluation today at a local Urgent Care facility, please use the link below. It will take you to a list of all of our available [facility], including address, phone number and hours of operation. Please do not delay care.  [facility]  If you or a family member do not have a primary care provider, use the link below to schedule a visit and establish care. When you choose a [facility] primary care physician or advanced practice provider, you gain a long-term partner in health. Find a Primary Care Provider  Learn more about [facility] in-office and virtual care options: [facility] - Get Care Now

## 2021-08-01 NOTE — Progress Notes (Signed)
Virtual Visit Consent   CATRINA Russell, you are scheduled for a virtual visit with a [facility] provider today.     Just as with appointments in the office, your consent must be obtained to participate.  Your consent will be active for this visit and any virtual visit you may have with one of our providers in the next 365 days.     If you have a MyChart account, a copy of this consent can be sent to you electronically.  All virtual visits are billed to your insurance company just like a traditional visit in the office.    As this is a virtual visit, video technology does not allow for your provider to perform a traditional examination.  This may limit your provider's ability to fully assess your condition.  If your provider identifies any concerns that need to be evaluated in person or the need to arrange testing (such as labs, EKG, etc.), we will make arrangements to do so.     Although advances in technology are sophisticated, we cannot ensure that it will always work on either your end or our end.  If the connection with a video visit is poor, the visit may have to be switched to a telephone visit.  With either a video or telephone visit, we are not always able to ensure that we have a secure connection.     I need to obtain your verbal consent now.   Are you willing to proceed with your visit today?    EMMER LILLIBRIDGE has provided verbal consent on 08/01/2021 for a virtual visit (video or telephone).   Margaretann Loveless, PA-C   Date: 08/01/2021 3:04 PM   Virtual Visit via Video Note   I, Margaretann Loveless, connected with  Cassandra Russell  ([id number], 11-Aug-1991) on 08/01/21 at  3:00 PM EDT by a video-enabled telemedicine application and verified that I am speaking with the correct person using two identifiers.  Location: Patient: Virtual Visit Location Patient: Home Provider: Virtual Visit Location Provider: Home Office   I discussed the limitations of  evaluation and management by telemedicine and the availability of in person appointments. The patient expressed understanding and agreed to proceed.    History of Present Illness: Cassandra Russell is a 30 y.o. who identifies as a female who was assigned female at birth, and is being seen today for possible UTI.  HPI: Urinary Tract Infection  This is a new problem. The current episode started 1 to 4 weeks ago. The problem occurs every urination. The problem has been gradually worsening. The quality of the pain is described as burning. The pain is mild. There has been no fever. Associated symptoms include frequency, hematuria, hesitancy and urgency. Pertinent negatives include no chills, discharge, flank pain, nausea or vomiting. She has tried increased fluids (AZO) for the symptoms. The treatment provided no relief. Her past medical history is significant for recurrent UTIs.     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 12/15/2016   H/O preterm delivery, currently pregnant, third trimester 12/15/2016   Rh negative state in antepartum period, third trimester 12/15/2016   Supervision of high risk pregnancy in third trimester 12/15/2016   Cervical cerclage suture present in third trimester 12/08/2016   History of  cervical cerclage 12/08/2016   History of supraventricular tachycardia 09/24/2015   Dysplasia of cervix, low grade (CIN 1) 08/11/2012    Allergies: No Known Allergies Medications:  Current Outpatient Medications:    nitrofurantoin, macrocrystal-monohydrate, (MACROBID) 100 MG capsule, Take 1 capsule (100 mg total) by mouth 2 (two) times daily., Disp: 10 capsule, Rfl: 0   Norethindrone Acetate-Ethinyl Estrad-FE (LOESTRIN 24 FE) 1-20 MG-MCG(24) tablet, Take  1 tablet by mouth daily., Disp: 3 Package, Rfl: 4  Observations/Objective: Patient is well-developed, well-nourished in no acute distress.  Resting comfortably at home.  Head is normocephalic, atraumatic.  No labored breathing.  Speech is clear and coherent with logical content.  Patient is alert and oriented at baseline.    Assessment and Plan: 1. Suspected UTI - nitrofurantoin, macrocrystal-monohydrate, (MACROBID) 100 MG capsule; Take 1 capsule (100 mg total) by mouth 2 (two) times daily.  Dispense: 10 capsule; Refill: 0  - Worsening symptoms.  - Will treat empirically with Macrobid. - Continue to push fluids.  - She is to call or seek in person evaluation if symptoms do not improve or if they worsen.   Follow Up Instructions: I discussed the assessment and treatment plan with the patient. The patient was provided an opportunity to ask questions and all were answered. The patient agreed with the plan and demonstrated an understanding of the instructions.  A copy of instructions were sent to the patient via MyChart unless otherwise noted below.   The patient was advised to call back or seek an in-person evaluation if the symptoms worsen or if the condition fails to improve as anticipated.  Time:  I spent 8 minutes with the patient via telehealth technology discussing the above problems/concerns.    Margaretann Loveless, PA-C

## 2021-12-20 ENCOUNTER — Telehealth: Payer: Medicaid Other | Admitting: Nurse Practitioner

## 2021-12-20 DIAGNOSIS — N3000 Acute cystitis without hematuria: Secondary | ICD-10-CM | POA: Diagnosis not present

## 2021-12-20 DIAGNOSIS — T3695XA Adverse effect of unspecified systemic antibiotic, initial encounter: Secondary | ICD-10-CM | POA: Diagnosis not present

## 2021-12-20 DIAGNOSIS — B379 Candidiasis, unspecified: Secondary | ICD-10-CM | POA: Diagnosis not present

## 2021-12-20 MED ORDER — CEPHALEXIN 500 MG PO CAPS: 500.0000 mg | ORAL_CAPSULE | Freq: Two times a day (BID) | ORAL | 0 refills | Status: AC

## 2021-12-20 MED ORDER — FLUCONAZOLE 150 MG PO TABS: 150.0000 mg | ORAL_TABLET | Freq: Once | ORAL | 0 refills | Status: AC

## 2021-12-20 NOTE — Progress Notes (Signed)
Virtual Visit Consent   Cassandra Russell, you are scheduled for a virtual visit with a [facility] provider today.     Just as with appointments in the office, your consent must be obtained to participate.  Your consent will be active for this visit and any virtual visit you may have with one of our providers in the next 365 days.     If you have a MyChart account, a copy of this consent can be sent to you electronically.  All virtual visits are billed to your insurance company just like a traditional visit in the office.    As this is a virtual visit, video technology does not allow for your provider to perform a traditional examination.  This may limit your provider's ability to fully assess your condition.  If your provider identifies any concerns that need to be evaluated in person or the need to arrange testing (such as labs, EKG, etc.), we will make arrangements to do so.     Although advances in technology are sophisticated, we cannot ensure that it will always work on either your end or our end.  If the connection with a video visit is poor, the visit may have to be switched to a telephone visit.  With either a video or telephone visit, we are not always able to ensure that we have a secure connection.     I need to obtain your verbal consent now.   Are you willing to proceed with your visit today?    Cassandra Russell has provided verbal consent on 12/20/2021 for a virtual visit (video or telephone).   Apolonio Schneiders, FNP   Date: 12/20/2021 10:45 AM   Virtual Visit via Video Note   I, Apolonio Schneiders, connected with  Cassandra Russell  ([id number], 1991/01/10) on 12/20/21 at 11:00 AM EST by a video-enabled telemedicine application and verified that I am speaking with the correct person using two identifiers.  Location: Patient: Virtual Visit Location Patient: Home Provider: Virtual Visit Location Provider: Home Office   I discussed the limitations of evaluation and  management by telemedicine and the availability of in person appointments. The patient expressed understanding and agreed to proceed.    History of Present Illness: Cassandra Russell is a 31 y.o. who identifies as a female who was assigned female at birth, and is being seen today with complaints of pain with urination that has been bothering her for the past week. She has also had urinary frequency and urgency as well. She has had one UTI recently last month.   Denies blood in urine, denies N/V back pain or fever Denies chance of pregnancy   She does often get yeast infections after using an antibiotic and requests diflucan for that as well.   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  Resting comfortably at home.  Head is normocephalic, atraumatic.  No labored breathing. Speech is clear and coherent with logical content.  Patient is alert and oriented at baseline.    Assessment and Plan: 1. Acute cystitis without hematuria  - cephALEXin (KEFLEX) 500 MG capsule; Take 1 capsule (500 mg total) by mouth 2 (two) times daily for 7 days.  Dispense: 14 capsule; Refill: 0  2. Antibiotic-induced  yeast infection  - fluconazole (DIFLUCAN) 150 MG tablet; Take 1 tablet (150 mg total) by mouth once for 1 dose.  Dispense: 1 tablet; Refill: 0     Follow Up Instructions: I discussed the assessment and treatment plan with the patient. The patient was provided an opportunity to ask questions and all were answered. The patient agreed with the plan and demonstrated an understanding of the instructions.  A copy of instructions were sent to the patient via MyChart unless otherwise noted below.    The patient was advised to call back or seek an in-person evaluation if the symptoms worsen or if the condition fails to improve as anticipated.  Time:  I spent 10 minutes with the patient via telehealth technology discussing the above problems/concerns.    Apolonio Schneiders, FNP

## 2022-03-25 DIAGNOSIS — Z113 Encounter for screening for infections with a predominantly sexual mode of transmission: Secondary | ICD-10-CM | POA: Diagnosis not present

## 2022-03-25 DIAGNOSIS — N760 Acute vaginitis: Secondary | ICD-10-CM | POA: Diagnosis not present

## 2022-03-31 ENCOUNTER — Encounter: Payer: Medicaid Other | Admitting: Radiology

## 2022-04-01 ENCOUNTER — Encounter: Payer: Self-pay | Admitting: Obstetrics & Gynecology

## 2022-04-01 ENCOUNTER — Ambulatory Visit (INDEPENDENT_AMBULATORY_CARE_PROVIDER_SITE_OTHER): Payer: 59 | Admitting: Obstetrics & Gynecology

## 2022-04-01 ENCOUNTER — Encounter: Payer: 59 | Admitting: Radiology

## 2022-04-01 ENCOUNTER — Other Ambulatory Visit (HOSPITAL_COMMUNITY)
Admission: RE | Admit: 2022-04-01 | Discharge: 2022-04-01 | Disposition: A | Discharge status: Home / Self Care | Payer: Medicaid Other | Source: Ambulatory Visit | Attending: Radiology | Admitting: Radiology

## 2022-04-01 VITALS — BP 118/74 | HR 90 | Resp 16 | Ht 60.75 in | Wt 156.0 lb

## 2022-04-01 DIAGNOSIS — N870 Mild cervical dysplasia: Secondary | ICD-10-CM

## 2022-04-01 DIAGNOSIS — Z01419 Encounter for gynecological examination (general) (routine) without abnormal findings: Secondary | ICD-10-CM | POA: Diagnosis not present

## 2022-04-01 DIAGNOSIS — Z30015 Encounter for initial prescription of vaginal ring hormonal contraceptive: Secondary | ICD-10-CM | POA: Diagnosis not present

## 2022-04-01 MED ORDER — ETONOGESTREL-ETHINYL ESTRADIOL 0.12-0.015 MG/24HR VA RING: 1.0000 | VAGINAL_RING | VAGINAL | 4 refills | Status: DC

## 2022-04-01 NOTE — Progress Notes (Signed)
Cassandra Russell 1990/11/19 [id number]   History:    31 y.o.  G2P2L2 Nurse.  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  Boyfriend x 1 year/Lives in [place].    RP: New (>3 yrs) patient presenting for annual gyn exam    HPI: Oligomenorrhea, h/o PCOS.  No Pelvic pain.  No pain with IC, but mild postcoital bleeding.  Pap Neg in 08/2018.  Pap reflex today.  STI screen Neg last week. On ABTX currently for BV and Cystitis.  Breasts wnl.  Urine/BMs wnl. BMI 29.72.  Walking.  Will start on a low Calorie/Carb diet.   Past medical history,surgical history, family history and social history were all reviewed and documented in the EPIC chart.  Gynecologic History Patient's last menstrual period was 02/19/2022 (exact date).  Obstetric History OB History  Gravida Para Term Preterm AB Living  2 2   2   2   SAB IAB Ectopic Multiple Live Births        0 2    # Outcome Date GA Lbr Len/2nd Weight Sex Delivery Anes PTL Lv  2 Preterm 03/05/17 [redacted]w[redacted]d 04:25 / 00:39 4 lb 3.7 oz (1.92 kg) M Vag-Spont EPI  LIV  1 Preterm  [redacted]w[redacted]d   M Vag-Spont  Y LIV     ROS: A ROS was performed and pertinent positives and negatives are included in the history.  GENERAL: No fevers or chills. HEENT: No change in vision, no earache, sore throat or sinus congestion. NECK: No pain or stiffness. CARDIOVASCULAR: No chest pain or pressure. No palpitations. PULMONARY: No shortness of breath, cough or wheeze. GASTROINTESTINAL: No abdominal pain, nausea, vomiting or diarrhea, melena or bright red blood per rectum. GENITOURINARY: No urinary frequency, urgency, hesitancy or dysuria. MUSCULOSKELETAL: No joint or muscle pain, no back pain, no recent trauma. DERMATOLOGIC: No rash, no itching, no lesions. ENDOCRINE: No polyuria, polydipsia, no heat or cold intolerance. No recent change in weight. HEMATOLOGICAL: No anemia or easy bruising or bleeding. NEUROLOGIC: No headache, seizures, numbness, tingling or weakness. PSYCHIATRIC: No depression, no loss of  interest in normal activity or change in sleep pattern.     Exam:   BP 118/74   Pulse 90   Resp 16   Ht 5' 0.75" (1.543 m)   Wt 156 lb (70.8 kg)   LMP 02/19/2022 (Exact Date)   BMI 29.72 kg/m   Body mass index is 29.72 kg/m.  General appearance : Well developed well nourished female. No acute distress HEENT: Eyes: no retinal hemorrhage or exudates,  Neck supple, trachea midline, no carotid bruits, no thyroidmegaly Lungs: Clear to auscultation, no rhonchi or wheezes, or rib retractions  Heart: Regular rate and rhythm, no murmurs or gallops Breast:Examined in sitting and supine position were symmetrical in appearance, no palpable masses or tenderness,  no skin retraction, no nipple inversion, no nipple discharge, no skin discoloration, no axillary or supraclavicular lymphadenopathy Abdomen: no palpable masses or tenderness, no rebound or guarding Extremities: no edema or skin discoloration or tenderness  Pelvic: Vulva: Normal             Vagina: No gross lesions or discharge  Cervix: No gross lesions or discharge.  Pap reflex done.  Uterus  AV, normal size, shape and consistency, non-tender and mobile  Adnexa  Without masses or tenderness  Anus: Normal   Assessment/Plan:  31 y.o. female for annual exam   1. Encounter for routine gynecological examination with Papanicolaou smear of cervix Oligomenorrhea, h/o PCOS.  No Pelvic pain.  No pain with IC, but mild postcoital bleeding.  Pap Neg in 08/2018.  Pap reflex today.  STI screen Neg last week. On ABTX currently for BV and Cystitis.  Breasts wnl.  Urine/BMs wnl. BMI 29.72.  Walking.  Will start on a low Calorie/Carb diet. - Cytology - PAP( [place])  2. H/O Dysplasia of cervix, low grade (CIN 1) - Cytology - PAP( [place])  3. Encounter for initial prescription of vaginal ring hormonal contraceptive Counseling on contraceptive choices.  Decision to start on Nuvaring.  Benefits and risks reviewed.  Usage discussed.   Prescription sent to pharmacy.  Other orders - nitrofurantoin, macrocrystal-monohydrate, (MACROBID) 100 MG capsule; Take 100 mg by mouth 2 (two) times daily. - metroNIDAZOLE (FLAGYL PO); Take by mouth. - etonogestrel-ethinyl estradiol (NUVARING) 0.12-0.015 MG/24HR vaginal ring; Place 1 each vaginally every 28 (twenty-eight) days. Insert vaginally and leave in place for 4 consecutive weeks, then switch to a new ring.  Continuous use.   Genia Del MD, 10:46 AM 04/01/2022

## 2022-04-03 LAB — CYTOLOGY - PAP
Diagnosis: NEGATIVE
Diagnosis: REACTIVE

## 2022-05-07 ENCOUNTER — Encounter: Payer: Self-pay | Admitting: Radiology

## 2022-05-07 ENCOUNTER — Ambulatory Visit (INDEPENDENT_AMBULATORY_CARE_PROVIDER_SITE_OTHER): Payer: 59 | Admitting: Radiology

## 2022-05-07 ENCOUNTER — Encounter: Payer: Self-pay | Admitting: Obstetrics & Gynecology

## 2022-05-07 VITALS — BP 116/64

## 2022-05-07 DIAGNOSIS — R300 Dysuria: Secondary | ICD-10-CM

## 2022-05-07 DIAGNOSIS — N760 Acute vaginitis: Secondary | ICD-10-CM | POA: Diagnosis not present

## 2022-05-07 LAB — WET PREP FOR TRICH, YEAST, CLUE

## 2022-05-07 NOTE — Progress Notes (Signed)
      Subjective: Cassandra Russell is a 31 y.o. female who complains of dysuria, frequency, urgency, vaginal irritation--x's 2 weeks (symptoms began after intercourse). 1 female partner, living together. Declines STI screen. Forgets to void after intercourse sometimes. 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  Review of Systems  All other systems reviewed and are negative.    Objective:  -Vulva: without lesions or discharge -Vagina: discharge present, aptima swab and wet prep obtained -Cervix: no lesion or discharge, no CMT -Perineum: no lesions -Uterus: Mobile, non tender -Adnexa: no masses or tenderness - Abdomen SNT - NO CVAT  Urine dipstick shows positive for leukocytes.  Micro exam: 10-20 WBC's per HPF and 0-2 RBC's per HPF.  Microscopic wet-mount exam shows negative for pathogens, normal epithelial cells.   Chaperone offered and declined.  Assessment:/Plan:   1. Acute vaginitis Reassured neg wet mount  - WET PREP FOR TRICH, YEAST, CLUE  2. Dysuria Will send C+S - Urinalysis,Complete w/RFL Culture    Will contact patient with results of testing completed today. Avoid intercourse until symptoms are resolved. Safe sex encouraged. Avoid the use of soaps or perfumed products in the peri area. Avoid tub baths and sitting in sweaty or wet clothing for prolonged periods of time.

## 2022-05-09 LAB — URINALYSIS, COMPLETE W/RFL CULTURE
Bilirubin Urine: NEGATIVE
Glucose, UA: NEGATIVE
Hgb urine dipstick: NEGATIVE
Hyaline Cast: NONE SEEN /LPF
Ketones, ur: NEGATIVE
Nitrites, Initial: NEGATIVE
Protein, ur: NEGATIVE
Specific Gravity, Urine: 1.02 (ref 1.001–1.035)
pH: 7 (ref 5.0–8.0)

## 2022-05-09 LAB — URINE CULTURE
MICRO NUMBER:: 13588675
SPECIMEN QUALITY:: ADEQUATE

## 2022-05-09 LAB — CULTURE INDICATED

## 2022-05-13 ENCOUNTER — Other Ambulatory Visit: Payer: Self-pay | Admitting: Radiology

## 2022-05-13 DIAGNOSIS — N390 Urinary tract infection, site not specified: Secondary | ICD-10-CM

## 2022-05-13 MED ORDER — NITROFURANTOIN MONOHYD MACRO 100 MG PO CAPS: 100.0000 mg | ORAL_CAPSULE | Freq: Two times a day (BID) | ORAL | 0 refills | Status: DC

## 2022-12-23 ENCOUNTER — Telehealth: Payer: 59 | Admitting: Nurse Practitioner

## 2022-12-23 DIAGNOSIS — N3000 Acute cystitis without hematuria: Secondary | ICD-10-CM

## 2022-12-23 MED ORDER — CEPHALEXIN 500 MG PO CAPS: 500.0000 mg | ORAL_CAPSULE | Freq: Two times a day (BID) | ORAL | 0 refills | Status: DC

## 2022-12-23 MED ORDER — FLUCONAZOLE 150 MG PO TABS: 150.0000 mg | ORAL_TABLET | Freq: Once | ORAL | 0 refills | Status: AC

## 2022-12-23 NOTE — Progress Notes (Signed)
Virtual Visit Consent   Cassandra Russell, you are scheduled for a virtual visit with Mary-Margaret Hassell Done, [place], a [facility] provider, today.     Just as with appointments in the office, your consent must be obtained to participate.  Your consent will be active for this visit and any virtual visit you may have with one of our providers in the next 365 days.     If you have a MyChart account, a copy of this consent can be sent to you electronically.  All virtual visits are billed to your insurance company just like a traditional visit in the office.    As this is a virtual visit, video technology does not allow for your provider to perform a traditional examination.  This may limit your provider's ability to fully assess your condition.  If your provider identifies any concerns that need to be evaluated in person or the need to arrange testing (such as labs, EKG, etc.), we will make arrangements to do so.     Although advances in technology are sophisticated, we cannot ensure that it will always work on either your end or our end.  If the connection with a video visit is poor, the visit may have to be switched to a telephone visit.  With either a video or telephone visit, we are not always able to ensure that we have a secure connection.     I need to obtain your verbal consent now.   Are you willing to proceed with your visit today? YES   Cassandra Russell has provided verbal consent on 12/23/2022 for a virtual visit (video or telephone).   Mary-Margaret Hassell Done, FNP   Date: 12/23/2022 4:52 PM   Virtual Visit via Video Note   I, Mary-Margaret Hassell Done, connected with Cassandra Russell ([id number], 1991-03-02) on 12/23/22 at  5:30 PM EST by a video-enabled telemedicine application and verified that I am speaking with the correct person using two identifiers.  Location: Patient: Virtual Visit Location Patient: Home Provider: Virtual Visit Location Provider: Mobile   I  discussed the limitations of evaluation and management by telemedicine and the availability of in person appointments. The patient expressed understanding and agreed to proceed.    History of Present Illness: Cassandra Russell is a 32 y.o. who identifies as a female who was assigned female at birth, and is being seen today for UTI.  HPI: Urinary Tract Infection  This is a recurrent problem. The current episode started more than 1 month ago. The problem has been waxing and waning. The quality of the pain is described as aching. The pain is at a severity of 5/10. The pain is moderate. There has been no fever. She is Sexually active. There is No history of pyelonephritis. Associated symptoms include frequency, hesitancy and urgency. Pertinent negatives include no discharge. She has tried nothing for the symptoms. The treatment provided no relief.    Review of Systems  Genitourinary:  Positive for frequency, hesitancy and urgency.    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 12/15/2016   H/O preterm delivery, currently pregnant, third trimester 12/15/2016   Rh negative state in antepartum period, third trimester 12/15/2016   Supervision of high risk pregnancy in third trimester 12/15/2016   Cervical cerclage suture present  in third trimester 12/08/2016   History of cervical cerclage 12/08/2016   History of supraventricular tachycardia 09/24/2015   Dysplasia of cervix, low grade (CIN 1) 08/11/2012    Allergies: No Known Allergies Medications:  Current Outpatient Medications:    etonogestrel-ethinyl estradiol (NUVARING) 0.12-0.015 MG/24HR vaginal ring, Place 1 each vaginally every 28 (twenty-eight) days. Insert vaginally and leave in place for 4 consecutive weeks,  then switch to a new ring.  Continuous use., Disp: 3 each, Rfl: 4   nitrofurantoin, macrocrystal-monohydrate, (MACROBID) 100 MG capsule, Take 1 capsule (100 mg total) by mouth 2 (two) times daily., Disp: 14 capsule, Rfl: 0  Observations/Objective: Patient is well-developed, well-nourished in no acute distress.  Resting comfortably  at home.  Head is normocephalic, atraumatic.  No labored breathing.  Speech is clear and coherent with logical content.  Patient is alert and oriented at baseline.    Assessment and Plan:  Cassandra Russell in today with chief complaint of Urinary Tract Infection   1. Acute cystitis without hematuria Take medication as prescribe Cotton underwear Take shower not bath Cranberry juice, yogurt Force fluids AZO over the counter X2 days RTO prn  Meds ordered this encounter  Medications   cephALEXin (KEFLEX) 500 MG capsule    Sig: Take 1 capsule (500 mg total) by mouth 2 (two) times daily.    Dispense:  14 capsule    Refill:  0    Order Specific Question:   Supervising Provider    Answer:   Chase Picket [id number]   fluconazole (DIFLUCAN) 150 MG tablet    Sig: Take 1 tablet (150 mg total) by mouth once for 1 dose.    Dispense:  1 tablet    Refill:  0    Order Specific Question:   Supervising Provider    Answer:   Chase Picket [id number]      Follow Up Instructions: I discussed the assessment and treatment plan with the patient. The patient was provided an opportunity to ask questions and all were answered. The patient agreed with the plan and demonstrated an understanding of the instructions.  A copy of instructions were sent to the patient via MyChart.  The patient was advised to call back or seek an in-person evaluation if the symptoms worsen or if the condition fails to improve as anticipated.  Time:  I spent 7 minutes with the patient via telehealth technology discussing the above problems/concerns.    Mary-Margaret Hassell Done,  FNP

## 2022-12-23 NOTE — Patient Instructions (Signed)
  Cassandra Russell, thank you for joining Chevis Pretty, FNP for today's virtual visit.  While this provider is not your primary care provider (PCP), if your PCP is located in our provider database this encounter information will be shared with them immediately following your visit.   [place] account gives you access to today's visit and all your visits, tests, and labs performed at [facility] " click here if you don't have a [facility] account or go to [email or web address]  Consent: (Patient) Cassandra Russell provided verbal consent for this virtual visit at the beginning of the encounter.  Current Medications:  Current Outpatient Medications:    cephALEXin (KEFLEX) 500 MG capsule, Take 1 capsule (500 mg total) by mouth 2 (two) times daily., Disp: 14 capsule, Rfl: 0   fluconazole (DIFLUCAN) 150 MG tablet, Take 1 tablet (150 mg total) by mouth once for 1 dose., Disp: 1 tablet, Rfl: 0   etonogestrel-ethinyl estradiol (NUVARING) 0.12-0.015 MG/24HR vaginal ring, Place 1 each vaginally every 28 (twenty-eight) days. Insert vaginally and leave in place for 4 consecutive weeks, then switch to a new ring.  Continuous use., Disp: 3 each, Rfl: 4   nitrofurantoin, macrocrystal-monohydrate, (MACROBID) 100 MG capsule, Take 1 capsule (100 mg total) by mouth 2 (two) times daily., Disp: 14 capsule, Rfl: 0   Medications ordered in this encounter:  Meds ordered this encounter  Medications   cephALEXin (KEFLEX) 500 MG capsule    Sig: Take 1 capsule (500 mg total) by mouth 2 (two) times daily.    Dispense:  14 capsule    Refill:  0    Order Specific Question:   Supervising Provider    Answer:   Chase Picket [[id number]]   fluconazole (DIFLUCAN) 150 MG tablet    Sig: Take 1 tablet (150 mg total) by mouth once for 1 dose.    Dispense:  1 tablet    Refill:  0    Order Specific Question:   Supervising Provider    Answer:   Chase Picket [id number]      *If you need refills on other medications prior to your next appointment, please contact your pharmacy*  Follow-Up: Call back or seek an in-person evaluation if the symptoms worsen or if the condition fails to improve as anticipated.  [place] [phone]  Other Instructions Take medication as prescribe Cotton underwear Take shower not bath Cranberry juice, yogurt Force fluids RTO prn    If you have been instructed to have an in-person evaluation today at a local Urgent Care facility, please use the link below. It will take you to a list of all of our available [facility], including address, phone number and hours of operation. Please do not delay care.  [facility]  If you or a family member do not have a primary care provider, use the link below to schedule a visit and establish care. When you choose a [facility] primary care physician or advanced practice provider, you gain a long-term partner in health. Find a Primary Care Provider  Learn more about [facility]'s in-office and virtual care options: [facility] Now

## 2023-01-10 ENCOUNTER — Telehealth: Payer: 59 | Admitting: Nurse Practitioner

## 2023-01-10 DIAGNOSIS — T3695XA Adverse effect of unspecified systemic antibiotic, initial encounter: Secondary | ICD-10-CM

## 2023-01-10 DIAGNOSIS — R399 Unspecified symptoms and signs involving the genitourinary system: Secondary | ICD-10-CM

## 2023-01-10 DIAGNOSIS — B379 Candidiasis, unspecified: Secondary | ICD-10-CM

## 2023-01-10 MED ORDER — FLUCONAZOLE 150 MG PO TABS: 150.0000 mg | ORAL_TABLET | Freq: Once | ORAL | 0 refills | Status: AC

## 2023-01-10 MED ORDER — NITROFURANTOIN MONOHYD MACRO 100 MG PO CAPS: 100.0000 mg | ORAL_CAPSULE | Freq: Two times a day (BID) | ORAL | 0 refills | Status: AC

## 2023-01-10 NOTE — Progress Notes (Signed)
Virtual Visit Consent   Cassandra Russell, you are scheduled for a virtual visit with a [facility] provider today. Just as with appointments in the office, your consent must be obtained to participate. Your consent will be active for this visit and any virtual visit you may have with one of our providers in the next 365 days. If you have a MyChart account, a copy of this consent can be sent to you electronically.  As this is a virtual visit, video technology does not allow for your provider to perform a traditional examination. This may limit your provider's ability to fully assess your condition. If your provider identifies any concerns that need to be evaluated in person or the need to arrange testing (such as labs, EKG, etc.), we will make arrangements to do so. Although advances in technology are sophisticated, we cannot ensure that it will always work on either your end or our end. If the connection with a video visit is poor, the visit may have to be switched to a telephone visit. With either a video or telephone visit, we are not always able to ensure that we have a secure connection.  By engaging in this virtual visit, you consent to the provision of healthcare and authorize for your insurance to be billed (if applicable) for the services provided during this visit. Depending on your insurance coverage, you may receive a charge related to this service.  I need to obtain your verbal consent now. Are you willing to proceed with your visit today? Cassandra Russell has provided verbal consent on 01/10/2023 for a virtual visit (video or telephone). Cassandra Pounds, NP  Date: 01/10/2023 9:08 AM  Virtual Visit via Video Note   I, Cassandra Russell, connected with  Cassandra Russell  ([id number], 33-Nov-1992) on 01/10/23 at  9:00 AM EST by a video-enabled telemedicine application and verified that I am speaking with the correct person using two identifiers.  Location: Patient: Virtual Visit  Location Patient: Home Provider: Virtual Visit Location Provider: Home Office   I discussed the limitations of evaluation and management by telemedicine and the availability of in person appointments. The patient expressed understanding and agreed to proceed.    History of Present Illness: Cassandra Russell is a 32 y.o. who identifies as a female who was assigned female at birth, and is being seen today for UTI.  Cassandra Russell states she was treated with Keflex a few weeks ago for UTI (12/23/2022).  This was via virtual visit.  Unfortunately while she was taking Keflex she developed gastroenteritis and is not sure if due to her stomach bug she was able to absorb all of the antibiotic she was prescribed. She notes re occuring symptoms of urinary frequency, urgency and bladder irritation.  She does not endorse any flank pain, hematuria, pelvic pressure or nausea or vomiting.     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 12/15/2016   H/O preterm delivery, currently pregnant, third trimester 12/15/2016   Rh negative state in antepartum period, third trimester 12/15/2016   Supervision of high risk pregnancy in third trimester 12/15/2016   Cervical cerclage suture present in third trimester 12/08/2016   History of cervical cerclage 12/08/2016   History of supraventricular tachycardia 09/24/2015   Dysplasia of cervix, low  grade (CIN 1) 08/11/2012    Allergies: No Known Allergies Medications:  Current Outpatient Medications:    fluconazole (DIFLUCAN) 150 MG tablet, Take 1 tablet (150 mg total) by mouth once for 1 dose. May repeat x 1 dose in 3 days if initial dose ineffective, Disp: 2 tablet, Rfl: 0   nitrofurantoin, macrocrystal-monohydrate, (MACROBID) 100 MG capsule, Take  1 capsule (100 mg total) by mouth 2 (two) times daily for 5 days., Disp: 10 capsule, Rfl: 0   cephALEXin (KEFLEX) 500 MG capsule, Take 1 capsule (500 mg total) by mouth 2 (two) times daily., Disp: 14 capsule, Rfl: 0   etonogestrel-ethinyl estradiol (NUVARING) 0.12-0.015 MG/24HR vaginal ring, Place 1 each vaginally every 28 (twenty-eight) days. Insert vaginally and leave in place for 4 consecutive weeks, then switch to a new ring.  Continuous use., Disp: 3 each, Rfl: 4  Observations/Objective: Patient is well-developed, well-nourished in no acute distress.  Resting comfortably  at home.  Head is normocephalic, atraumatic.  No labored breathing.  Speech is clear and coherent with logical content.  Patient is alert and oriented at baseline.    Assessment and Plan: 1. UTI symptoms - nitrofurantoin, macrocrystal-monohydrate, (MACROBID) 100 MG capsule; Take 1 capsule (100 mg total) by mouth 2 (two) times daily for 5 days.  Dispense: 10 capsule; Refill: 0  2. Antibiotic-induced yeast infection - fluconazole (DIFLUCAN) 150 MG tablet; Take 1 tablet (150 mg total) by mouth once for 1 dose. May repeat x 1 dose in 3 days if initial dose ineffective  Dispense: 2 tablet; Refill: 0    Follow Up Instructions: I discussed the assessment and treatment plan with the patient. The patient was provided an opportunity to ask questions and all were answered. The patient agreed with the plan and demonstrated an understanding of the instructions.  A copy of instructions were sent to the patient via MyChart unless otherwise noted below.    The patient was advised to call back or seek an in-person evaluation if the symptoms worsen or if the condition fails to improve as anticipated.  Time:  I spent 11 minutes with the patient via telehealth technology discussing the above problems/concerns.    Cassandra Pounds, NP

## 2023-01-10 NOTE — Patient Instructions (Signed)
  Cassandra Russell, thank you for joining Gildardo Pounds, NP for today's virtual visit.  While this provider is not your primary care provider (PCP), if your PCP is located in our provider database this encounter information will be shared with them immediately following your visit.   [place] account gives you access to today's visit and all your visits, tests, and labs performed at [facility] " click here if you don't have a [place] account or go to [email or web address]  Consent: (Patient) Cassandra Russell provided verbal consent for this virtual visit at the beginning of the encounter.  Current Medications:  Current Outpatient Medications:    fluconazole (DIFLUCAN) 150 MG tablet, Take 1 tablet (150 mg total) by mouth once for 1 dose. May repeat x 1 dose in 3 days if initial dose ineffective, Disp: 2 tablet, Rfl: 0   nitrofurantoin, macrocrystal-monohydrate, (MACROBID) 100 MG capsule, Take 1 capsule (100 mg total) by mouth 2 (two) times daily for 5 days., Disp: 10 capsule, Rfl: 0   cephALEXin (KEFLEX) 500 MG capsule, Take 1 capsule (500 mg total) by mouth 2 (two) times daily., Disp: 14 capsule, Rfl: 0   etonogestrel-ethinyl estradiol (NUVARING) 0.12-0.015 MG/24HR vaginal ring, Place 1 each vaginally every 28 (twenty-eight) days. Insert vaginally and leave in place for 4 consecutive weeks, then switch to a new ring.  Continuous use., Disp: 3 each, Rfl: 4   Medications ordered in this encounter:  Meds ordered this encounter  Medications   nitrofurantoin, macrocrystal-monohydrate, (MACROBID) 100 MG capsule    Sig: Take 1 capsule (100 mg total) by mouth 2 (two) times daily for 5 days.    Dispense:  10 capsule    Refill:  0    Order Specific Question:   Supervising Provider    Answer:   Chase Picket [id number]   fluconazole (DIFLUCAN) 150 MG tablet    Sig: Take 1 tablet (150 mg total) by mouth once for 1 dose. May repeat x 1 dose in 3  days if initial dose ineffective    Dispense:  2 tablet    Refill:  0    Order Specific Question:   Supervising Provider    Answer:   Chase Picket [id number]     *If you need refills on other medications prior to your next appointment, please contact your pharmacy*  Follow-Up: Call back or seek an in-person evaluation if the symptoms worsen or if the condition fails to improve as anticipated.  [place] [phone]  Other Instructions Follow-up in office with PCP or urgent care for urine culture if no improvement in symptoms   If you have been instructed to have an in-person evaluation today at a local Urgent Care facility, please use the link below. It will take you to a list of all of our available [facility]gent [facility], including address, phone number and hours of operation. Please do not delay care.  [facility]  If you or a family member do not have a primary care provider, use the link below to schedule a visit and establish care. When you choose a [facility] primary care physician or advanced practice provider, you gain a long-term partner in health. Find a Primary Care Provider  Learn more about [facility]'s in-office and virtual care options: [facility] Now

## 2023-04-02 ENCOUNTER — Other Ambulatory Visit (HOSPITAL_BASED_OUTPATIENT_CLINIC_OR_DEPARTMENT_OTHER): Payer: Self-pay

## 2023-04-02 ENCOUNTER — Ambulatory Visit (INDEPENDENT_AMBULATORY_CARE_PROVIDER_SITE_OTHER): Payer: 59 | Admitting: Obstetrics & Gynecology

## 2023-04-02 ENCOUNTER — Encounter: Payer: Self-pay | Admitting: Obstetrics & Gynecology

## 2023-04-02 VITALS — BP 114/68 | HR 89 | Ht 60.75 in | Wt 164.0 lb

## 2023-04-02 DIAGNOSIS — Z01419 Encounter for gynecological examination (general) (routine) without abnormal findings: Secondary | ICD-10-CM | POA: Diagnosis not present

## 2023-04-02 DIAGNOSIS — Z3044 Encounter for surveillance of vaginal ring hormonal contraceptive device: Secondary | ICD-10-CM

## 2023-04-02 DIAGNOSIS — N912 Amenorrhea, unspecified: Secondary | ICD-10-CM | POA: Diagnosis not present

## 2023-04-02 LAB — PREGNANCY, URINE: Preg Test, Ur: NEGATIVE

## 2023-04-02 MED ORDER — ETONOGESTREL-ETHINYL ESTRADIOL 0.12-0.015 MG/24HR VA RING
1.0000 | VAGINAL_RING | VAGINAL | 4 refills | Status: DC
  Filled 2023-04-02: qty 3, 84d supply, fill #0
  Filled 2023-08-28 – 2023-12-03 (×3): qty 3, 84d supply, fill #1

## 2023-04-02 NOTE — Progress Notes (Signed)
DESI FRED Aug 16, 1991 [id number]   History:    32 y.o.  G2P2L2 Nurse in NICU.  Sons 15 and 6 yo.  Boyfriend x 2 years.    RP: Established patient presenting for annual gyn exam    HPI: On continuous Nuvaring, but sub-optimal compliance, sometimes delays the switch of ring, keeping the same ring in up to 5-6 weeks.  H/o PCOS.  No Pelvic pain.  No pain with IC.  Pap Neg in 03/2022.  Will repeat Pap at 2 years.  STI screen Neg last year, declines today. Breasts wnl.  Urine/BMs wnl. BMI 31.24.  Walking.     Past medical history,surgical history, family history and social history were all reviewed and documented in the EPIC chart.  Gynecologic History No LMP recorded. (Menstrual status: Irregular Periods).  Obstetric History OB History  Gravida Para Term Preterm AB Living  2 2   2   2   SAB IAB Ectopic Multiple Live Births        0 2    # Outcome Date GA Lbr Len/2nd Weight Sex Delivery Anes PTL Lv  2 Preterm 03/05/17 [redacted]w[redacted]d 04:25 / 00:39 4 lb 3.7 oz (1.92 kg) M Vag-Spont EPI  LIV  1 Preterm  [redacted]w[redacted]d   M Vag-Spont  Y LIV     ROS: A ROS was performed and pertinent positives and negatives are included in the history. GENERAL: No fevers or chills. HEENT: No change in vision, no earache, sore throat or sinus congestion. NECK: No pain or stiffness. CARDIOVASCULAR: No chest pain or pressure. No palpitations. PULMONARY: No shortness of breath, cough or wheeze. GASTROINTESTINAL: No abdominal pain, nausea, vomiting or diarrhea, melena or bright red blood per rectum. GENITOURINARY: No urinary frequency, urgency, hesitancy or dysuria. MUSCULOSKELETAL: No joint or muscle pain, no back pain, no recent trauma. DERMATOLOGIC: No rash, no itching, no lesions. ENDOCRINE: No polyuria, polydipsia, no heat or cold intolerance. No recent change in weight. HEMATOLOGICAL: No anemia or easy bruising or bleeding. NEUROLOGIC: No headache, seizures, numbness, tingling or weakness. PSYCHIATRIC: No depression, no  loss of interest in normal activity or change in sleep pattern.     Exam:   BP 114/68   Pulse 89   Ht 5' 0.75" (1.543 m)   Wt 164 lb (74.4 kg)   SpO2 99%   BMI 31.24 kg/m   Body mass index is 31.24 kg/m.  General appearance : Well developed well nourished female. No acute distress HEENT: Eyes: no retinal hemorrhage or exudates,  Neck supple, trachea midline, no carotid bruits, no thyroidmegaly Lungs: Clear to auscultation, no rhonchi or wheezes, or rib retractions  Heart: Regular rate and rhythm, no murmurs or gallops Breast:Examined in sitting and supine position were symmetrical in appearance, no palpable masses or tenderness,  no skin retraction, no nipple inversion, no nipple discharge, no skin discoloration, no axillary or supraclavicular lymphadenopathy Abdomen: no palpable masses or tenderness, no rebound or guarding Extremities: no edema or skin discoloration or tenderness  Pelvic: Vulva: Normal             Vagina: No gross lesions or discharge  Cervix: No gross lesions or discharge  Uterus  AV, normal size, shape and consistency, non-tender and mobile  Adnexa  Without masses or tenderness  Anus: Normal  UPT Negative   Assessment/Plan:  32 y.o. female for annual exam   1. Well female exam with routine gynecological exam On continuous Nuvaring, but sub-optimal compliance, sometimes delays the switch of ring, keeping the  same ring in up to 5-6 weeks.  H/o PCOS.  No Pelvic pain.  No pain with IC.  Pap Neg in 03/2022.  Will repeat Pap at 2 years.  STI screen Neg last year, declines today. Breasts wnl.  Urine/BMs wnl. BMI 31.24.  Walking.    2. Encounter for surveillance of vaginal ring hormonal contraceptive device On continuous Nuvaring, but sub-optimal compliance, sometimes delays the switch of ring, keeping the same ring in up to 5-6 weeks.  H/o PCOS.  No Pelvic pain.  No pain with IC.  Importance of compliance reviewed.  Recommend condoms if delays the switch of ring.   No CI to continue, prescription sent to pharmacy. UPT Neg.  - Pregnancy, urine  Other orders - etonogestrel-ethinyl estradiol (NUVARING) 0.12-0.015 MG/24HR vaginal ring; Place 1 each vaginally every 28 (twenty-eight) days. Insert vaginally and leave in place for 4 consecutive weeks, then switch to a new ring.  Continuous use.   Genia Del MD, 9:40 AM

## 2023-04-19 ENCOUNTER — Other Ambulatory Visit (HOSPITAL_BASED_OUTPATIENT_CLINIC_OR_DEPARTMENT_OTHER): Payer: Self-pay

## 2023-04-19 DIAGNOSIS — H109 Unspecified conjunctivitis: Secondary | ICD-10-CM | POA: Diagnosis not present

## 2023-04-19 DIAGNOSIS — R350 Frequency of micturition: Secondary | ICD-10-CM | POA: Diagnosis not present

## 2023-04-19 DIAGNOSIS — R300 Dysuria: Secondary | ICD-10-CM | POA: Diagnosis not present

## 2023-04-19 MED ORDER — MOXIFLOXACIN HCL 0.5 % OP SOLN
1.0000 [drp] | Freq: Three times a day (TID) | OPHTHALMIC | 0 refills | Status: AC
  Filled 2023-04-19: qty 3, 7d supply, fill #0

## 2023-07-11 ENCOUNTER — Telehealth: Payer: 59 | Admitting: Family Medicine

## 2023-07-11 DIAGNOSIS — N390 Urinary tract infection, site not specified: Secondary | ICD-10-CM | POA: Diagnosis not present

## 2023-07-11 MED ORDER — NITROFURANTOIN MONOHYD MACRO 100 MG PO CAPS: 100.0000 mg | ORAL_CAPSULE | Freq: Two times a day (BID) | ORAL | 0 refills | Status: AC

## 2023-07-11 NOTE — Patient Instructions (Signed)
Urinary Tract Infection, Adult  A urinary tract infection (UTI) is an infection of any part of the urinary tract. The urinary tract includes the kidneys, ureters, bladder, and urethra. These organs make, store, and get rid of urine in the body. An upper UTI affects the ureters and kidneys. A lower UTI affects the bladder and urethra. What are the causes? Most urinary tract infections are caused by bacteria in your genital area around your urethra, where urine leaves your body. These bacteria grow and cause inflammation of your urinary tract. What increases the risk? You are more likely to develop this condition if: You have a urinary catheter that stays in place. You are not able to control when you urinate or have a bowel movement (incontinence). You are female and you: Use a spermicide or diaphragm for birth control. Have low estrogen levels. Are pregnant. You have certain genes that increase your risk. You are sexually active. You take antibiotic medicines. You have a condition that causes your flow of urine to slow down, such as: An enlarged prostate, if you are female. Blockage in your urethra. A kidney stone. A nerve condition that affects your bladder control (neurogenic bladder). Not getting enough to drink, or not urinating often. You have certain medical conditions, such as: Diabetes. A weak disease-fighting system (immunesystem). Sickle cell disease. Gout. Spinal cord injury. What are the signs or symptoms? Symptoms of this condition include: Needing to urinate right away (urgency). Frequent urination. This may include small amounts of urine each time you urinate. Pain or burning with urination. Blood in the urine. Urine that smells bad or unusual. Trouble urinating. Cloudy urine. Vaginal discharge, if you are female. Pain in the abdomen or the lower back. You may also have: Vomiting or a decreased appetite. Confusion. Irritability or tiredness. A fever or  chills. Diarrhea. The first symptom in older adults may be confusion. In some cases, they may not have any symptoms until the infection has worsened. How is this diagnosed? This condition is diagnosed based on your medical history and a physical exam. You may also have other tests, including: Urine tests. Blood tests. Tests for STIs (sexually transmitted infections). If you have had more than one UTI, a cystoscopy or imaging studies may be done to determine the cause of the infections. How is this treated? Treatment for this condition includes: Antibiotic medicine. Over-the-counter medicines to treat discomfort. Drinking enough water to stay hydrated. If you have frequent infections or have other conditions such as a kidney stone, you may need to see a health care provider who specializes in the urinary tract (urologist). In rare cases, urinary tract infections can cause sepsis. Sepsis is a life-threatening condition that occurs when the body responds to an infection. Sepsis is treated in the hospital with IV antibiotics, fluids, and other medicines. Follow these instructions at home:  Medicines Take over-the-counter and prescription medicines only as told by your health care provider. If you were prescribed an antibiotic medicine, take it as told by your health care provider. Do not stop using the antibiotic even if you start to feel better. General instructions Make sure you: Empty your bladder often and completely. Do not hold urine for long periods of time. Empty your bladder after sex. Wipe from front to back after urinating or having a bowel movement if you are female. Use each tissue only one time when you wipe. Drink enough fluid to keep your urine pale yellow. Keep all follow-up visits. This is important. Contact a health   care provider if: Your symptoms do not get better after 1-2 days. Your symptoms go away and then return. Get help right away if: You have severe pain in  your back or your lower abdomen. You have a fever or chills. You have nausea or vomiting. Summary A urinary tract infection (UTI) is an infection of any part of the urinary tract, which includes the kidneys, ureters, bladder, and urethra. Most urinary tract infections are caused by bacteria in your genital area. Treatment for this condition often includes antibiotic medicines. If you were prescribed an antibiotic medicine, take it as told by your health care provider. Do not stop using the antibiotic even if you start to feel better. Keep all follow-up visits. This is important. This information is not intended to replace advice given to you by your health care provider. Make sure you discuss any questions you have with your health care provider. Document Revised: 06/02/2020 Document Reviewed: 06/07/2020 Elsevier Patient Education  2024 [employer]  

## 2023-07-11 NOTE — Progress Notes (Signed)
Virtual Visit Consent   Cassandra Russell, you are scheduled for a virtual visit with a [facility] provider today. Just as with appointments in the office, your consent must be obtained to participate. Your consent will be active for this visit and any virtual visit you may have with one of our providers in the next 365 days. If you have a MyChart account, a copy of this consent can be sent to you electronically.  As this is a virtual visit, video technology does not allow for your provider to perform a traditional examination. This may limit your provider's ability to fully assess your condition. If your provider identifies any concerns that need to be evaluated in person or the need to arrange testing (such as labs, EKG, etc.), we will make arrangements to do so. Although advances in technology are sophisticated, we cannot ensure that it will always work on either your end or our end. If the connection with a video visit is poor, the visit may have to be switched to a telephone visit. With either a video or telephone visit, we are not always able to ensure that we have a secure connection.  By engaging in this virtual visit, you consent to the provision of healthcare and authorize for your insurance to be billed (if applicable) for the services provided during this visit. Depending on your insurance coverage, you may receive a charge related to this service.  I need to obtain your verbal consent now. Are you willing to proceed with your visit today? Cassandra Russell has provided verbal consent on 07/11/2023 for a virtual visit (video or telephone). Georgana Curio, FNP  Date: 07/11/2023 10:03 AM  Virtual Visit via Video Note   I, Georgana Curio, connected with  Cassandra Russell  ([id number], [birth year]) on 07/11/23 at 10:00 AM EDT by a video-enabled telemedicine application and verified that I am speaking with the correct person using two identifiers.  Location: Patient: Virtual Visit  Location Patient: Home Provider: Virtual Visit Location Provider: Home Office   I discussed the limitations of evaluation and management by telemedicine and the availability of in person appointments. The patient expressed understanding and agreed to proceed.    History of Present Illness: Cassandra Russell is a 32 y.o. who identifies as a female who was assigned female at birth, and is being seen today for burning and frequency with urination. No fever. Marland Kitchen  HPI: HPI  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 12/15/2016   H/O preterm delivery, currently pregnant, third trimester 12/15/2016   Rh negative state in antepartum period, third trimester 12/15/2016   Supervision of high risk pregnancy in third trimester 12/15/2016   Cervical cerclage suture present in third trimester 12/08/2016   History of cervical cerclage 12/08/2016   History of supraventricular tachycardia 09/24/2015   Dysplasia of cervix, low grade (CIN 1) 08/11/2012    Allergies: No Known Allergies Medications:  Current Outpatient Medications:    etonogestrel-ethinyl estradiol (NUVARING) 0.12-0.015 MG/24HR vaginal ring, Place 1 each vaginally every 28 (twenty-eight) days. Insert vaginally and leave in place for 4 consecutive weeks, then switch to a new ring.  Continuous use., Disp: 3 each, Rfl: 4  Observations/Objective: Patient is well-developed, well-nourished in no acute distress.  Resting comfortably  at home.  Head  is normocephalic, atraumatic.  No labored breathing.  Speech is clear and coherent with logical content.  Patient is alert and oriented at baseline.    Assessment and Plan: 1. Urinary tract infection without hematuria, site unspecified  Increase fluids, preventative  measures discussed, rtc as needed.   Follow Up Instructions: I discussed the assessment and treatment plan with the patient. The patient was provided an opportunity to ask questions and all were answered. The patient agreed with the plan and demonstrated an understanding of the instructions.  A copy of instructions were sent to the patient via MyChart unless otherwise noted below.     The patient was advised to call back or seek an in-person evaluation if the symptoms worsen or if the condition fails to improve as anticipated.  Time:  I spent 10 minutes with the patient via telehealth technology discussing the above problems/concerns.    Georgana Curio, FNP

## 2023-08-30 ENCOUNTER — Other Ambulatory Visit: Payer: Self-pay

## 2023-08-30 ENCOUNTER — Other Ambulatory Visit (HOSPITAL_BASED_OUTPATIENT_CLINIC_OR_DEPARTMENT_OTHER): Payer: Self-pay

## 2023-09-09 ENCOUNTER — Other Ambulatory Visit (HOSPITAL_BASED_OUTPATIENT_CLINIC_OR_DEPARTMENT_OTHER): Payer: Self-pay

## 2023-12-03 ENCOUNTER — Other Ambulatory Visit (HOSPITAL_BASED_OUTPATIENT_CLINIC_OR_DEPARTMENT_OTHER): Payer: Self-pay

## 2024-01-02 ENCOUNTER — Telehealth: Payer: 59 | Admitting: Family

## 2024-01-02 DIAGNOSIS — R399 Unspecified symptoms and signs involving the genitourinary system: Secondary | ICD-10-CM

## 2024-01-02 MED ORDER — CEPHALEXIN 500 MG PO CAPS: 500.0000 mg | ORAL_CAPSULE | Freq: Two times a day (BID) | ORAL | 0 refills | Status: DC

## 2024-01-02 MED ORDER — FLUCONAZOLE 150 MG PO TABS: 150.0000 mg | ORAL_TABLET | ORAL | 0 refills | Status: DC | PRN

## 2024-01-02 NOTE — Addendum Note (Signed)
 Addended by: Jannifer Rodney A on: 01/02/2024 04:32 PM   Modules accepted: Orders

## 2024-01-02 NOTE — Progress Notes (Signed)

## 2024-03-08 ENCOUNTER — Emergency Department (HOSPITAL_BASED_OUTPATIENT_CLINIC_OR_DEPARTMENT_OTHER)
Admission: EM | Admit: 2024-03-08 | Discharge: 2024-03-08 | Disposition: A | Discharge status: Home / Self Care | Attending: Emergency Medicine | Admitting: Emergency Medicine

## 2024-03-08 ENCOUNTER — Other Ambulatory Visit (HOSPITAL_BASED_OUTPATIENT_CLINIC_OR_DEPARTMENT_OTHER): Payer: Self-pay

## 2024-03-08 ENCOUNTER — Encounter (HOSPITAL_BASED_OUTPATIENT_CLINIC_OR_DEPARTMENT_OTHER): Payer: Self-pay | Admitting: Emergency Medicine

## 2024-03-08 ENCOUNTER — Emergency Department (HOSPITAL_BASED_OUTPATIENT_CLINIC_OR_DEPARTMENT_OTHER)

## 2024-03-08 ENCOUNTER — Other Ambulatory Visit: Payer: Self-pay

## 2024-03-08 DIAGNOSIS — R109 Unspecified abdominal pain: Secondary | ICD-10-CM | POA: Diagnosis present

## 2024-03-08 DIAGNOSIS — N132 Hydronephrosis with renal and ureteral calculous obstruction: Secondary | ICD-10-CM | POA: Diagnosis not present

## 2024-03-08 DIAGNOSIS — R112 Nausea with vomiting, unspecified: Secondary | ICD-10-CM | POA: Diagnosis not present

## 2024-03-08 DIAGNOSIS — N200 Calculus of kidney: Secondary | ICD-10-CM

## 2024-03-08 LAB — BASIC METABOLIC PANEL WITH GFR
Anion gap: 14 (ref 5–15)
BUN: 14 mg/dL (ref 6–20)
CO2: 24 mmol/L (ref 22–32)
Calcium: 9.3 mg/dL (ref 8.9–10.3)
Chloride: 103 mmol/L (ref 98–111)
Creatinine, Ser: 0.86 mg/dL (ref 0.44–1.00)
GFR, Estimated: >60 mL/min (ref >60)
Glucose, Bld: 131 mg/dL — ABNORMAL HIGH (ref 70–99)
Potassium: 3.3 mmol/L — ABNORMAL LOW (ref 3.5–5.1)
Sodium: 141 mmol/L (ref 135–145)

## 2024-03-08 LAB — CBC
HCT: 36.1 % (ref 36.0–46.0)
Hemoglobin: 12.1 g/dL (ref 12.0–15.0)
MCH: 29.4 pg (ref 26.0–34.0)
MCHC: 33.5 g/dL (ref 30.0–36.0)
MCV: 87.6 fL (ref 80.0–100.0)
Platelets: 376 10*3/uL (ref 150–400)
RBC: 4.12 MIL/uL (ref 3.87–5.11)
RDW: 11.8 % (ref 11.5–15.5)
WBC: 10.5 10*3/uL (ref 4.0–10.5)
nRBC: 0 % (ref 0.0–0.2)

## 2024-03-08 LAB — HCG, SERUM, QUALITATIVE: Preg, Serum: NEGATIVE

## 2024-03-08 MED ORDER — OXYCODONE-ACETAMINOPHEN 5-325 MG PO TABS
1.0000 | ORAL_TABLET | Freq: Four times a day (QID) | ORAL | 0 refills | Status: DC | PRN
  Filled 2024-03-08: qty 20, 3d supply, fill #0

## 2024-03-08 MED ORDER — KETOROLAC TROMETHAMINE 30 MG/ML IJ SOLN
30.0000 mg | Freq: Once | INTRAMUSCULAR | Status: AC
  Administered 2024-03-08: 30 mg via INTRAVENOUS
  Filled 2024-03-08: qty 1

## 2024-03-08 MED ORDER — MORPHINE SULFATE (PF) 4 MG/ML IV SOLN
4.0000 mg | Freq: Once | INTRAVENOUS | Status: AC
  Administered 2024-03-08: 4 mg via INTRAVENOUS
  Filled 2024-03-08: qty 1

## 2024-03-08 MED ORDER — ONDANSETRON HCL 4 MG/2ML IJ SOLN
4.0000 mg | Freq: Once | INTRAMUSCULAR | Status: AC
  Administered 2024-03-08: 4 mg via INTRAVENOUS
  Filled 2024-03-08: qty 2

## 2024-03-08 NOTE — ED Notes (Signed)
 ED Provider at bedside.

## 2024-03-08 NOTE — ED Triage Notes (Addendum)
 Patient presents with right flank pain x 40 min. +nausea, vomiting upon arrival to ED. Denies diff voiding

## 2024-03-08 NOTE — ED Provider Notes (Signed)
 [place] EMERGENCY DEPARTMENT AT [facility] Provider Note   CSN: [id number] Arrival date & time: 03/08/24  0413     History  Chief Complaint  Patient presents with   Flank Pain    Cassandra Russell is a 33 y.o. female.  Patient is a 33 year old female presenting with complaints of right flank pain.  This woke her from sleep approximately 45 minutes ago.  She describes severe pain from the right flank radiating to the right groin.  She is nauseated, but has not vomited.  No fevers or chills.  She denies any bowel or bladder complaints.  No prior history of kidney stones.       Home Medications Prior to Admission medications   Medication Sig Start Date End Date Taking? Authorizing Provider  cephALEXin  (KEFLEX ) 500 MG capsule Take 1 capsule (500 mg total) by mouth 2 (two) times daily. 01/02/24   Yevette Hem, FNP  etonogestrel -ethinyl estradiol  (NUVARING) 0.12-0.015 MG/24HR vaginal ring Place 1 each vaginally every 28 (twenty-eight) days. Insert vaginally and leave in place for 4 consecutive weeks, then switch to a new ring.  Continuous use. 04/02/23   Lavoie, Marie-Lyne, MD  fluconazole  (DIFLUCAN ) 150 MG tablet Take 1 tablet (150 mg total) by mouth every three (3) days as needed. 01/02/24   Yevette Hem, FNP      Allergies    Patient has no known allergies.    Review of Systems   Review of Systems  All other systems reviewed and are negative.   Physical Exam Updated Vital Signs BP 137/89   Pulse 86   Temp 98.1 F (36.7 C) (Oral)   Resp (!) 22   SpO2 100%  Physical Exam Vitals and nursing note reviewed.  Constitutional:      General: She is not in acute distress.    Appearance: She is well-developed. She is not diaphoretic.  HENT:     Head: Normocephalic and atraumatic.  Cardiovascular:     Rate and Rhythm: Normal rate and regular rhythm.     Heart sounds: No murmur heard.    No friction rub. No gallop.  Pulmonary:     Effort: Pulmonary  effort is normal. No respiratory distress.     Breath sounds: Normal breath sounds. No wheezing.  Abdominal:     General: Bowel sounds are normal. There is no distension.     Palpations: Abdomen is soft.     Tenderness: There is no abdominal tenderness. There is right CVA tenderness. There is no left CVA tenderness, guarding or rebound.  Musculoskeletal:        General: Normal range of motion.     Cervical back: Normal range of motion and neck supple.  Skin:    General: Skin is warm and dry.  Neurological:     General: No focal deficit present.     Mental Status: She is alert and oriented to person, place, and time.     ED Results / Procedures / Treatments   Labs (all labs ordered are listed, but only abnormal results are displayed) Labs Reviewed  CBC  BASIC METABOLIC PANEL WITH GFR  URINALYSIS, ROUTINE W REFLEX MICROSCOPIC  PREGNANCY, URINE    EKG None  Radiology No results found.  Procedures Procedures    Medications Ordered in ED Medications  morphine (PF) 4 MG/ML injection 4 mg (has no administration in time range)  ketorolac  (TORADOL ) 30 MG/ML injection 30 mg (has no administration in time range)  ondansetron  (ZOFRAN ) injection 4  mg (has no administration in time range)    ED Course/ Medical Decision Making/ A&P  Patient is a 33 year old female presenting with sudden onset of right flank pain.  Patient arrives with stable vital signs and is afebrile.  She is quite uncomfortable appearing and has right-sided CVA tenderness on exam.  Abdomen is benign.  Laboratory studies obtained including CBC and basic metabolic panel, both of which are unremarkable.  Pregnancy test is negative.  CT scan with renal protocol obtained showing what appears to be a 3 mm stone in the distal ureter.  Patient has received Toradol  and morphine for pain and Zofran  for nausea and is feeling significantly improved.  Patient to be discharged with Percocet and as needed follow-up with  urology, but do anticipate spontaneous passage of the stone.  Final Clinical Impression(s) / ED Diagnoses Final diagnoses:  None    Rx / DC Orders ED Discharge Orders     None         Orvilla Blander, MD 03/08/24 [phone]

## 2024-03-08 NOTE — Discharge Instructions (Addendum)
 Begin taking Percocet as prescribed as needed for pain.  Follow-up with [facility] if the stone has not passed in the next few days.  Return to the ER if symptoms significantly worsen or change.

## 2024-03-12 ENCOUNTER — Encounter (HOSPITAL_BASED_OUTPATIENT_CLINIC_OR_DEPARTMENT_OTHER): Payer: Self-pay

## 2024-03-12 ENCOUNTER — Emergency Department (HOSPITAL_BASED_OUTPATIENT_CLINIC_OR_DEPARTMENT_OTHER)
Admission: EM | Admit: 2024-03-12 | Discharge: 2024-03-12 | Disposition: A | Discharge status: Home / Self Care | Attending: Emergency Medicine | Admitting: Emergency Medicine

## 2024-03-12 ENCOUNTER — Emergency Department (HOSPITAL_BASED_OUTPATIENT_CLINIC_OR_DEPARTMENT_OTHER)

## 2024-03-12 DIAGNOSIS — I878 Other specified disorders of veins: Secondary | ICD-10-CM | POA: Diagnosis not present

## 2024-03-12 DIAGNOSIS — N200 Calculus of kidney: Secondary | ICD-10-CM | POA: Insufficient documentation

## 2024-03-12 DIAGNOSIS — N201 Calculus of ureter: Secondary | ICD-10-CM | POA: Diagnosis not present

## 2024-03-12 DIAGNOSIS — R109 Unspecified abdominal pain: Secondary | ICD-10-CM | POA: Diagnosis present

## 2024-03-12 HISTORY — DX: Calculus of kidney: N20.0

## 2024-03-12 MED ORDER — SODIUM CHLORIDE 0.9 % IV BOLUS
1000.0000 mL | Freq: Once | INTRAVENOUS | Status: AC
  Administered 2024-03-12: 1000 mL via INTRAVENOUS

## 2024-03-12 MED ORDER — MORPHINE SULFATE (PF) 4 MG/ML IV SOLN
4.0000 mg | Freq: Once | INTRAVENOUS | Status: AC
  Administered 2024-03-12: 4 mg via INTRAVENOUS
  Filled 2024-03-12: qty 1

## 2024-03-12 MED ORDER — TAMSULOSIN HCL 0.4 MG PO CAPS: 0.4000 mg | ORAL_CAPSULE | Freq: Every day | ORAL | 0 refills | Status: DC

## 2024-03-12 MED ORDER — KETOROLAC TROMETHAMINE 30 MG/ML IJ SOLN
30.0000 mg | Freq: Once | INTRAMUSCULAR | Status: AC
  Administered 2024-03-12: 30 mg via INTRAVENOUS
  Filled 2024-03-12: qty 1

## 2024-03-12 MED ORDER — OXYCODONE-ACETAMINOPHEN 5-325 MG PO TABS
1.0000 | ORAL_TABLET | Freq: Once | ORAL | Status: AC
  Administered 2024-03-12: 1 via ORAL
  Filled 2024-03-12: qty 1

## 2024-03-12 MED ORDER — OXYCODONE-ACETAMINOPHEN 5-325 MG PO TABS: 1.0000 | ORAL_TABLET | Freq: Four times a day (QID) | ORAL | 0 refills | Status: DC | PRN

## 2024-03-12 MED ORDER — IBUPROFEN 600 MG PO TABS: 600.0000 mg | ORAL_TABLET | Freq: Four times a day (QID) | ORAL | 0 refills | Status: DC | PRN

## 2024-03-12 NOTE — ED Notes (Signed)
 ED Provider at bedside.

## 2024-03-12 NOTE — ED Provider Notes (Signed)
 [place] EMERGENCY DEPARTMENT AT [facility] Provider Note   CSN: [id number] Arrival date & time: 03/12/24  0015     History  Chief Complaint  Patient presents with   Flank Pain    Cassandra Russell is a 33 y.o. female.  HPI     This is a 33 year old female who presents with persistent flank pain.  Patient reports that she was diagnosed with a right-sided kidney stone several days ago.  She states that she has used her pain medication at home with minimal relief.  She has had persistent worsening pain today.  She has not noted to pass stone.  She denies any fevers or dysuria.  She is alternating Tylenol  and Motrin .  Chart reviewed.  She was seen on 4/30 and diagnosed with a 3 mm stone on the right.  Close to the UVJ.  Home Medications Prior to Admission medications   Medication Sig Start Date End Date Taking? Authorizing Provider  ibuprofen  (ADVIL ) 600 MG tablet Take 1 tablet (600 mg total) by mouth every 6 (six) hours as needed. 03/12/24  Yes Blanchie Zeleznik, Vonzella Guernsey, MD  tamsulosin (FLOMAX) 0.4 MG CAPS capsule Take 1 capsule (0.4 mg total) by mouth daily. 03/12/24  Yes Amori Cooperman, Vonzella Guernsey, MD  cephALEXin  (KEFLEX ) 500 MG capsule Take 1 capsule (500 mg total) by mouth 2 (two) times daily. 01/02/24   Yevette Hem, FNP  etonogestrel -ethinyl estradiol  (NUVARING) 0.12-0.015 MG/24HR vaginal ring Place 1 each vaginally every 28 (twenty-eight) days. Insert vaginally and leave in place for 4 consecutive weeks, then switch to a new ring.  Continuous use. 04/02/23   Lavoie, Marie-Lyne, MD  fluconazole  (DIFLUCAN ) 150 MG tablet Take 1 tablet (150 mg total) by mouth every three (3) days as needed. 01/02/24   Yevette Hem, FNP  oxyCODONE -acetaminophen  (PERCOCET) 5-325 MG tablet Take 1 tablet by mouth every 6 (six) hours as needed. 03/12/24   Lanard Arguijo, Vonzella Guernsey, MD      Allergies    Patient has no known allergies.    Review of Systems   Review of Systems  Constitutional:  Negative  for fever.  Respiratory:  Negative for shortness of breath.   Cardiovascular:  Negative for chest pain.  Genitourinary:  Positive for flank pain. Negative for dysuria and hematuria.  All other systems reviewed and are negative.   Physical Exam Updated Vital Signs BP 135/87 (BP Location: Right Arm)   Pulse 84   Temp 98.5 F (36.9 C) (Oral)   Resp 16   SpO2 100%  Physical Exam Vitals and nursing note reviewed.  Constitutional:      Appearance: She is well-developed. She is not ill-appearing.  HENT:     Head: Normocephalic and atraumatic.  Eyes:     Pupils: Pupils are equal, round, and reactive to light.  Cardiovascular:     Rate and Rhythm: Normal rate and regular rhythm.     Heart sounds: Normal heart sounds.  Pulmonary:     Effort: Pulmonary effort is normal. No respiratory distress.     Breath sounds: No wheezing.  Abdominal:     General: Bowel sounds are normal.     Palpations: Abdomen is soft.     Tenderness: There is no right CVA tenderness or left CVA tenderness.  Musculoskeletal:     Cervical back: Neck supple.  Skin:    General: Skin is warm and dry.  Neurological:     Mental Status: She is alert and oriented to person, place, and time.  Psychiatric:        Mood and Affect: Mood normal.     ED Results / Procedures / Treatments   Labs (all labs ordered are listed, but only abnormal results are displayed) Labs Reviewed - No data to display  EKG None  Radiology DG Abdomen 1 View Result Date: 03/12/2024 CLINICAL DATA:  Follow-up right ureteral stone EXAM: ABDOMEN - 1 VIEW COMPARISON:  03/08/2024 FINDINGS: Scattered large and small bowel gas is noted. The distal right ureteral stone is poorly visualized on this exam. Multiple phleboliths are seen. No other focal abnormality is noted. IMPRESSION: Known distal right ureteral stone is not well appreciated on this exam. Electronically Signed   By: Violeta Grey M.D.   On: 03/12/2024 02:48    Procedures Procedures     Medications Ordered in ED Medications  sodium chloride  0.9 % bolus 1,000 mL (0 mLs Intravenous Stopped 03/12/24 0253)  ketorolac  (TORADOL ) 30 MG/ML injection 30 mg (30 mg Intravenous Given 03/12/24 0050)  oxyCODONE -acetaminophen  (PERCOCET/ROXICET) 5-325 MG per tablet 1 tablet (1 tablet Oral Given 03/12/24 0050)  morphine  (PF) 4 MG/ML injection 4 mg (4 mg Intravenous Given 03/12/24 0139)    ED Course/ Medical Decision Making/ A&P                                 Medical Decision Making Amount and/or Complexity of Data Reviewed Radiology: ordered.  Risk Prescription drug management.   This patient presents to the ED for concern of flank pain, this involves an extensive number of treatment options, and is a complaint that carries with it a high risk of complications and morbidity.  I considered the following differential and admission for this acute, potentially life threatening condition.  The differential diagnosis includes ongoing renal colic, UTI, pyelonephritis  MDM:    The this is a 33 year old female with a known kidney stone who presents with ongoing flank pain.  She is nontoxic and afebrile.  Vital signs are reassuring.  Suspect ongoing renal colic.  Abdominal x-ray obtained and does not will visualize the 3 mm stone.  She is not having any other symptoms at this time.  Will focus on pain control.  She is not taking scheduled NSAIDs.  Last creatinine was reassuring.  Patient was given a dose of Toradol  and Percocet.  She was subsequently given a dose of morphine .  On recheck, she states she feels better.  Discussed with her that passage of stone can take days to weeks but it is close to the bladder.  Will repeat fill pain medication and recommend scheduled NSAIDs and Flomax as well.  Follow-up with urology.  (Labs, imaging, consults)  Labs: I Ordered, and personally interpreted labs.  The pertinent results include: None  Imaging Studies ordered: I ordered imaging studies including  KUB I independently visualized and interpreted imaging. I agree with the radiologist interpretation  Additional history obtained from chart review.  External records from outside source obtained and reviewed including prior evaluations  Cardiac Monitoring: The patient was not maintained on a cardiac monitor.  If on the cardiac monitor, I personally viewed and interpreted the cardiac monitored which showed an underlying rhythm of: N/A  Reevaluation: After the interventions noted above, I reevaluated the patient and found that they have :improved  Social Determinants of Health:  lives independently  Disposition: Discharge  Co morbidities that complicate the patient evaluation  Past Medical History:  Diagnosis Date  Chlamydia    Conjunctivitis    DVT (deep vein thrombosis) in pregnancy 2018   arm   History of chlamydia 04/13/2012   HSV-2 infection    Kidney stone    PCOS (polycystic ovarian syndrome) 10/31/2014   Preterm labor    SVT (supraventricular tachycardia) (HCC) 10/2013   Adenosine  was given in the ED ([facility]), pt was not admitted.     Medicines Meds ordered this encounter  Medications   sodium chloride  0.9 % bolus 1,000 mL   ketorolac  (TORADOL ) 30 MG/ML injection 30 mg   oxyCODONE -acetaminophen  (PERCOCET/ROXICET) 5-325 MG per tablet 1 tablet    Refill:  0   morphine  (PF) 4 MG/ML injection 4 mg   oxyCODONE -acetaminophen  (PERCOCET) 5-325 MG tablet    Sig: Take 1 tablet by mouth every 6 (six) hours as needed.    Dispense:  10 tablet    Refill:  0   ibuprofen  (ADVIL ) 600 MG tablet    Sig: Take 1 tablet (600 mg total) by mouth every 6 (six) hours as needed.    Dispense:  30 tablet    Refill:  0   tamsulosin (FLOMAX) 0.4 MG CAPS capsule    Sig: Take 1 capsule (0.4 mg total) by mouth daily.    Dispense:  30 capsule    Refill:  0    I have reviewed the patients home medicines and have made adjustments as needed  Problem List / ED Course: Problem List Items  Addressed This Visit   None Visit Diagnoses       Kidney stone    -  Primary   Relevant Medications   oxyCODONE -acetaminophen  (PERCOCET/ROXICET) 5-325 MG per tablet 1 tablet (Completed)   morphine  (PF) 4 MG/ML injection 4 mg (Completed)   oxyCODONE -acetaminophen  (PERCOCET) 5-325 MG tablet                   Final Clinical Impression(s) / ED Diagnoses Final diagnoses:  Kidney stone    Rx / DC Orders ED Discharge Orders          Ordered    oxyCODONE -acetaminophen  (PERCOCET) 5-325 MG tablet  Every 6 hours PRN        03/12/24 0351    ibuprofen  (ADVIL ) 600 MG tablet  Every 6 hours PRN        03/12/24 0351    tamsulosin (FLOMAX) 0.4 MG CAPS capsule  Daily        03/12/24 0351              Rory Collard, MD 03/12/24 0400

## 2024-03-12 NOTE — ED Triage Notes (Signed)
 Pt states that she has known kidney stone on the R side, ran out of pain meds, pain and nausea continues.

## 2024-03-15 ENCOUNTER — Ambulatory Visit: Admitting: Family Medicine

## 2024-03-28 ENCOUNTER — Ambulatory Visit: Admitting: Family Medicine

## 2024-04-04 ENCOUNTER — Ambulatory Visit: Payer: 59 | Admitting: Radiology

## 2024-04-05 ENCOUNTER — Encounter: Payer: Self-pay | Admitting: Family Medicine

## 2024-04-05 ENCOUNTER — Ambulatory Visit (INDEPENDENT_AMBULATORY_CARE_PROVIDER_SITE_OTHER): Payer: Self-pay | Admitting: Family Medicine

## 2024-04-05 VITALS — BP 114/76 | HR 76 | Temp 97.7°F | Ht 60.75 in | Wt 171.0 lb

## 2024-04-05 DIAGNOSIS — E282 Polycystic ovarian syndrome: Secondary | ICD-10-CM

## 2024-04-05 DIAGNOSIS — R5383 Other fatigue: Secondary | ICD-10-CM | POA: Diagnosis not present

## 2024-04-05 DIAGNOSIS — E66811 Obesity, class 1: Secondary | ICD-10-CM

## 2024-04-05 DIAGNOSIS — Z87442 Personal history of urinary calculi: Secondary | ICD-10-CM

## 2024-04-05 DIAGNOSIS — N939 Abnormal uterine and vaginal bleeding, unspecified: Secondary | ICD-10-CM

## 2024-04-05 DIAGNOSIS — I471 Supraventricular tachycardia, unspecified: Secondary | ICD-10-CM | POA: Insufficient documentation

## 2024-04-05 DIAGNOSIS — Z6832 Body mass index (BMI) 32.0-32.9, adult: Secondary | ICD-10-CM | POA: Diagnosis not present

## 2024-04-05 HISTORY — DX: Supraventricular tachycardia, unspecified: I47.10

## 2024-04-05 LAB — COMPREHENSIVE METABOLIC PANEL WITH GFR
ALT: 15 U/L (ref 0–35)
AST: 17 U/L (ref 0–37)
Albumin: 4.6 g/dL (ref 3.5–5.2)
Alkaline Phosphatase: 62 U/L (ref 39–117)
BUN: 11 mg/dL (ref 6–23)
CO2: 29 meq/L (ref 19–32)
Calcium: 9.8 mg/dL (ref 8.4–10.5)
Chloride: 102 meq/L (ref 96–112)
Creatinine, Ser: 0.69 mg/dL (ref 0.40–1.20)
GFR: 113.3 mL/min (ref >60.00)
Glucose, Bld: 83 mg/dL (ref 70–99)
Potassium: 3.8 meq/L (ref 3.5–5.1)
Sodium: 138 meq/L (ref 135–145)
Total Bilirubin: 0.3 mg/dL (ref 0.2–1.2)
Total Protein: 7.4 g/dL (ref 6.0–8.3)

## 2024-04-05 LAB — CBC WITH DIFFERENTIAL/PLATELET
Basophils Absolute: 0 10*3/uL (ref 0.0–0.1)
Basophils Relative: 0.5 % (ref 0.0–3.0)
Eosinophils Absolute: 0.2 10*3/uL (ref 0.0–0.7)
Eosinophils Relative: 2.9 % (ref 0.0–5.0)
HCT: 40.1 % (ref 36.0–46.0)
Hemoglobin: 13.3 g/dL (ref 12.0–15.0)
Lymphocytes Relative: 34.5 % (ref 12.0–46.0)
Lymphs Abs: 2.9 10*3/uL (ref 0.7–4.0)
MCHC: 33.3 g/dL (ref 30.0–36.0)
MCV: 86.8 fl (ref 78.0–100.0)
Monocytes Absolute: 0.5 10*3/uL (ref 0.1–1.0)
Monocytes Relative: 5.3 % (ref 3.0–12.0)
Neutro Abs: 4.8 10*3/uL (ref 1.4–7.7)
Neutrophils Relative %: 56.8 % (ref 43.0–77.0)
Platelets: 333 10*3/uL (ref 150.0–400.0)
RBC: 4.62 Mil/uL (ref 3.87–5.11)
RDW: 12.9 % (ref 11.5–15.5)
WBC: 8.5 10*3/uL (ref 4.0–10.5)

## 2024-04-05 LAB — HCG, SERUM, QUALITATIVE: Preg, Serum: NEGATIVE

## 2024-04-05 LAB — VITAMIN D 25 HYDROXY (VIT D DEFICIENCY, FRACTURES): VITD: 14.36 ng/mL — ABNORMAL LOW (ref 30.00–100.00)

## 2024-04-05 LAB — HEMOGLOBIN A1C: Hgb A1c MFr Bld: 5.5 % (ref 4.6–6.5)

## 2024-04-05 LAB — FOLATE: Folate: >23.2 ng/mL (ref >5.9)

## 2024-04-05 LAB — FERRITIN: Ferritin: 92.6 ng/mL (ref 10.0–291.0)

## 2024-04-05 LAB — TSH: TSH: 0.53 u[IU]/mL (ref 0.35–5.50)

## 2024-04-05 LAB — VITAMIN B12: Vitamin B-12: 229 pg/mL (ref 211–911)

## 2024-04-05 LAB — T4, FREE: Free T4: 0.83 ng/dL (ref 0.60–1.60)

## 2024-04-05 NOTE — Progress Notes (Signed)
 New Patient Office Visit  Subjective    Patient ID: Cassandra Russell, female    DOB: 1991-06-10  Age: 33 y.o. MRN: [id number]  CC:  Chief Complaint  Patient presents with   Establish Care    PCOS, wants to discuss risk for diabetes    HPI STANLEY HELMUTH presents to establish care No previous PCP   She sees her OB/GYN   Reports having PCOS. She has a Nuvaring   She likes sweets.  Feeling tired for the past few weeks   She has an appt with OB June 3rd.   Recently passed kidney stone. Did not feel it.   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 Insert vaginally and leave in place for 4 consecutive weeks, then switch to a new ring.  Continuous use. (Patient not taking: Reported on 04/05/2024)   ibuprofen  (ADVIL ) 600 MG tablet Take 1 tablet (600 mg total) by mouth every 6 (six) hours as needed. (Patient not taking: Reported on 04/05/2024)   tamsulosin  (FLOMAX ) 0.4 MG CAPS capsule Take 1 capsule (0.4 mg total) by mouth daily. (Patient not taking: Reported on 04/05/2024)   [DISCONTINUED] cephALEXin  (KEFLEX ) 500 MG capsule Take 1 capsule (500 mg total) by mouth 2 (two) times daily.   [DISCONTINUED] fluconazole  (DIFLUCAN ) 150 MG tablet Take 1 tablet (150 mg total) by mouth every three (3) days as needed.   [DISCONTINUED] oxyCODONE -acetaminophen  (PERCOCET) 5-325 MG tablet Take 1 tablet by mouth every 6 (six) hours as needed.   No facility-administered encounter medications on file as of 04/05/2024.    Past Medical History:  Diagnosis Date   Chlamydia    Conjunctivitis    DVT (deep vein thrombosis) in pregnancy 2018   arm   History of chlamydia 04/13/2012    History of preterm delivery 09/22/2016   PTL [redacted]w[redacted]d     HSV-2 infection    Kidney stone    PCOS (polycystic ovarian syndrome) 10/31/2014   Preterm labor    Supraventricular tachycardia (HCC) 04/05/2024   s/p eval by cards - at next visit see if pt still f/w cards     SVT (supraventricular tachycardia) (HCC) 10/2013   Adenosine  was given in the ED ([facility]), pt was not admitted.    Past Surgical History:  Procedure Laterality Date   CERVICAL CERCLAGE N/A 11/11/2016   Procedure: CERCLAGE CERVICAL;  Surgeon: Hamp Levine, MD;  Location: [facility];  Service: Gynecology;  Laterality: N/A;   CLOSED REDUCTION FINGER WITH PERCUTANEOUS PINNING Left 01/24/2014   Procedure: LEFT SMALL FINGER CLOSED REDUCTION WITH PINNING/POSSIBLE ORIF;  Surgeon: Shellie Dials, MD;  Location: [facility] OR;  Service: Orthopedics;  Laterality: Left;   FRACTURE SURGERY     L pinky   INTRAUTERINE DEVICE INSERTION  11/10/2007   WISDOM TOOTH EXTRACTION      Family History  Problem Relation Age of Onset   Hypertension Mother    Hyperlipidemia Mother    Hyperlipidemia Father    Diabetes Maternal Grandmother     Social History   Socioeconomic History   Marital status: Single    Spouse name: Not on file   Number of children: Not on  file   Years of education: Not on file   Highest education level: Bachelor's degree (e.g., BA, AB, BS)  Occupational History   Not on file  Tobacco Use   Smoking status: Never   Smokeless tobacco: Never  Vaping Use   Vaping status: Never Used  Substance and Sexual Activity   Alcohol use: Not Currently    Comment: occ   Drug use: No   Sexual activity: Yes    Partners: Male    Birth control/protection: Inserts    Comment: 1st intercourse- 15, nuvaring  Other Topics Concern   Not on file  Social History Narrative   Not on file   Social Drivers of Health   Financial Resource Strain: Low Risk  (03/13/2024)   Overall Financial Resource Strain (CARDIA)    Difficulty of Paying  Living Expenses: Not hard at all  Food Insecurity: No Food Insecurity (03/13/2024)   Hunger Vital Sign    Worried About Running Out of Food in the Last Year: Never true    Ran Out of Food in the Last Year: Never true  Transportation Needs: No Transportation Needs (03/13/2024)   PRAPARE - Administrator, Civil Service (Medical): No    Lack of Transportation (Non-Medical): No  Physical Activity: Unknown (03/13/2024)   Exercise Vital Sign    Days of Exercise per Week: Patient declined    Minutes of Exercise per Session: Not on file  Stress: No Stress Concern Present (03/13/2024)   [employer] of Occupational Health - Occupational Stress Questionnaire    Feeling of Stress : Not at all  Social Connections: Moderately Isolated (03/13/2024)   Social Connection and Isolation Panel [NHANES]    Frequency of Communication with Friends and Family: More than three times a week    Frequency of Social Gatherings with Friends and Family: Once a week    Attends Religious Services: Never    Database administrator or Organizations: No    Attends Engineer, structural: Not on file    Marital Status: Living with partner  Intimate Partner Violence: Not on file    Review of Systems  Constitutional:  Positive for malaise/fatigue. Negative for chills and fever.  HENT:  Negative for congestion and sore throat.   Eyes:  Negative for blurred vision and double vision.  Respiratory:  Negative for cough and shortness of breath.   Cardiovascular:  Negative for chest pain, palpitations and leg swelling.  Gastrointestinal:  Negative for abdominal pain, constipation, diarrhea, nausea and vomiting.  Genitourinary:  Negative for dysuria, frequency and urgency.  Neurological:  Negative for dizziness, tingling, focal weakness and headaches.  Psychiatric/Behavioral:  Negative for depression. The patient is not nervous/anxious.         Objective    BP 114/76 (BP Location: Left Arm, Patient  Position: Sitting)   Pulse 76   Temp 97.7 F (36.5 C) (Temporal)   Ht 5' 0.75" (1.543 m)   Wt 171 lb (77.6 kg)   SpO2 98%   BMI 32.58 kg/m   Physical Exam Constitutional:      General: She is not in acute distress.    Appearance: She is not ill-appearing.  HENT:     Mouth/Throat:     Mouth: Mucous membranes are moist.  Eyes:     Extraocular Movements: Extraocular movements intact.     Conjunctiva/sclera: Conjunctivae normal.  Cardiovascular:     Rate and Rhythm: Normal rate and regular rhythm.  Pulmonary:  Effort: Pulmonary effort is normal.     Breath sounds: Normal breath sounds.  Musculoskeletal:     Cervical back: Normal range of motion and neck supple.     Right lower leg: No edema.     Left lower leg: No edema.  Skin:    General: Skin is warm and dry.  Neurological:     General: No focal deficit present.     Mental Status: She is alert and oriented to person, place, and time.     Cranial Nerves: No cranial nerve deficit.     Motor: No weakness.     Coordination: Coordination normal.     Gait: Gait normal.  Psychiatric:        Mood and Affect: Mood normal.        Behavior: Behavior normal.        Thought Content: Thought content normal.         Assessment & Plan:   Problem List Items Addressed This Visit     History of renal stone   Obesity (BMI 30.0-34.9)   Other Visit Diagnoses       Fatigue, unspecified type    -  Primary   Relevant Orders   CBC with Differential/Platelet   Comprehensive metabolic panel with GFR   Ferritin   Folate   TSH   T4, free   Vitamin B12   VITAMIN D 25 Hydroxy (Vit-D Deficiency, Fractures)     Abnormal uterine bleeding (AUB)       Relevant Orders   TSH   T4, free   hCG, serum, qualitative     PCOS (polycystic ovarian syndrome)       Relevant Orders   Hemoglobin A1c      She is a pleasant 34 year old female who is new to the practice and here to establish care. We discussed her increased risk of  developing diabetes due to the PCOS history.  Counseling on a diet low in sugar and carbohydrates and getting adequate physical activity.  She has been feeling more tired recently.  This may be multifactorial.  She does work nights in the NICU. AUB.  Prolonged periods when she does have them lasting approximately 1 to 2 weeks.  Screen for anemia. Check A1c. Follow-up pending lab results.  She has an appointment to see her OB/GYN in the near future.  Return for fasting CPE at their convenience.   Alyson Back, NP-C

## 2024-04-05 NOTE — Patient Instructions (Signed)
 Thank you for trusting us  with your health care.  Please go downstairs for labs before you leave.  I will be in touch with your results and with recommendations.  Cut back on sugar and carbohydrates as we discussed.  Try to get at least 150 minutes of physical activity each week.  Follow-up for fasting CPE at your convenience.

## 2024-04-06 ENCOUNTER — Ambulatory Visit: Payer: Self-pay | Admitting: Family Medicine

## 2024-04-11 ENCOUNTER — Ambulatory Visit: Payer: 59 | Admitting: Radiology

## 2024-04-11 ENCOUNTER — Ambulatory Visit (INDEPENDENT_AMBULATORY_CARE_PROVIDER_SITE_OTHER): Admitting: Nurse Practitioner

## 2024-04-11 ENCOUNTER — Encounter: Payer: Self-pay | Admitting: Nurse Practitioner

## 2024-04-11 VITALS — BP 108/64 | Ht 61.0 in | Wt 170.6 lb

## 2024-04-11 DIAGNOSIS — Z1331 Encounter for screening for depression: Secondary | ICD-10-CM | POA: Diagnosis not present

## 2024-04-11 DIAGNOSIS — Z3044 Encounter for surveillance of vaginal ring hormonal contraceptive device: Secondary | ICD-10-CM

## 2024-04-11 DIAGNOSIS — Z01419 Encounter for gynecological examination (general) (routine) without abnormal findings: Secondary | ICD-10-CM

## 2024-04-11 DIAGNOSIS — E282 Polycystic ovarian syndrome: Secondary | ICD-10-CM | POA: Diagnosis not present

## 2024-04-11 MED ORDER — ETONOGESTREL-ETHINYL ESTRADIOL 0.12-0.015 MG/24HR VA RING
1.0000 | VAGINAL_RING | VAGINAL | 4 refills | Status: AC
  Filled 2024-07-06: qty 3, 84d supply, fill #0

## 2024-04-11 NOTE — Progress Notes (Signed)
 Cassandra Russell 10-20-1991 [id number]   History:  33 y.o. G2P0202 presents for annual exam. Nuvaring continuously. H/O PCOS. Having irregular periods and bleeding can last 2-3 weeks. Feels this always happens when she gains weight. Wants to continue Nuvaring. Planning to try to conceive in 1-2 years. Took 4 years to conceive with last baby who is 69 years old. H/O abnormal pap with neg biopsies. H/O 2018 DVT of arm, SVT.   Gynecologic History Patient's last menstrual period was 02/08/2024 (approximate). Period Duration (Days): 10 Period Pattern: (!) Irregular (hx PCOS) Menstrual Flow: Light Menstrual Control: Thin pad, Maxi pad, Tampon Dysmenorrhea: (!) Mild Dysmenorrhea Symptoms: Cramping Contraception/Family planning: NuvaRing vaginal inserts Sexually active: Yes  Health Maintenance Last Pap: 04/01/2022. Results were: Normal Last mammogram: Not indicated Last colonoscopy: Not indicated Last Dexa: Not indicated      04/11/2024   10:03 AM  Depression screen PHQ 2/9  Decreased Interest 0  Down, Depressed, Hopeless 0  PHQ - 2 Score 0     Past medical history, past surgical history, family history and social history were all reviewed and documented in the EPIC chart. 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   ROS:  A ROS was performed and pertinent positives and negatives are included.  Exam:  Vitals:   04/11/24 1001  BP: 108/64  Weight: 170 lb 9.6 oz (77.4 kg)  Height: 5\' 1"  (1.549 m)   Body mass index is 32.23 kg/m.  General appearance:  Normal Thyroid :  Symmetrical, normal in size, without palpable masses or nodularity. Respiratory  Auscultation:  Clear without wheezing or rhonchi Cardiovascular  Auscultation:  Regular rate, without rubs, murmurs or gallops  Edema/varicosities:  Not grossly evident Abdominal  Soft,nontender, without masses, guarding or rebound.  Liver/spleen:  No organomegaly noted  Hernia:  None appreciated  Skin  Inspection:  Grossly  normal Breasts: Examined lying and sitting.   Right: Without masses, retractions, nipple discharge or axillary adenopathy.   Left: Without masses, retractions, nipple discharge or axillary adenopathy. Pelvic: External genitalia:  no lesions              Urethra:  normal appearing urethra with no masses, tenderness or lesions              Bartholins and Skenes: normal                 Vagina: normal appearing vagina with normal color and discharge, no lesions              Cervix: no lesions Bimanual Exam:  Uterus:  no masses or tenderness              Adnexa: no mass, fullness, tenderness              Rectovaginal: Deferred              Anus:  normal, no lesions  Patient informed chaperone available to be present for breast and pelvic exam. Patient has requested no chaperone to be present. Patient has been advised what will be completed during breast and pelvic exam.   Assessment/Plan:  33 y.o. [id number] for annual exam.   Well female exam with routine gynecological exam - Education provided on SBEs, importance of preventative screenings, current guidelines, high calcium  diet, regular exercise, and multivitamin daily. Labs with PCP.   Encounter for surveillance of vaginal ring hormonal contraceptive device - Plan: etonogestrel -ethinyl estradiol  (NUVARING) 0.12-0.015 MG/24HR vaginal ring continuously.   PCOS (polycystic ovarian syndrome) -  Nuvaring. H/O irregular periods. Sometimes bleeding last 2-3 weeks. Wants to continue Nuvaring.   Screening for cervical cancer - Normal Pap history.  Will repeat at 3-year interval per guidelines.  Return in about 1 year (around 04/11/2025) for Annual.    Andee Bamberger DNP, 10:29 AM 04/11/2024

## 2024-06-20 ENCOUNTER — Ambulatory Visit: Admitting: Family Medicine

## 2024-07-06 ENCOUNTER — Ambulatory Visit (INDEPENDENT_AMBULATORY_CARE_PROVIDER_SITE_OTHER): Admitting: Family Medicine

## 2024-07-06 ENCOUNTER — Ambulatory Visit: Payer: Self-pay | Admitting: Family Medicine

## 2024-07-06 ENCOUNTER — Encounter

## 2024-07-06 ENCOUNTER — Other Ambulatory Visit (HOSPITAL_BASED_OUTPATIENT_CLINIC_OR_DEPARTMENT_OTHER): Payer: Self-pay

## 2024-07-06 VITALS — BP 112/80 | HR 75 | Temp 97.6°F | Ht 61.0 in | Wt 166.0 lb

## 2024-07-06 DIAGNOSIS — R300 Dysuria: Secondary | ICD-10-CM | POA: Diagnosis not present

## 2024-07-06 DIAGNOSIS — N939 Abnormal uterine and vaginal bleeding, unspecified: Secondary | ICD-10-CM | POA: Diagnosis not present

## 2024-07-06 DIAGNOSIS — E559 Vitamin D deficiency, unspecified: Secondary | ICD-10-CM

## 2024-07-06 DIAGNOSIS — Z13220 Encounter for screening for lipoid disorders: Secondary | ICD-10-CM | POA: Diagnosis not present

## 2024-07-06 DIAGNOSIS — N3001 Acute cystitis with hematuria: Secondary | ICD-10-CM | POA: Diagnosis not present

## 2024-07-06 DIAGNOSIS — Z0001 Encounter for general adult medical examination with abnormal findings: Secondary | ICD-10-CM | POA: Diagnosis not present

## 2024-07-06 DIAGNOSIS — E282 Polycystic ovarian syndrome: Secondary | ICD-10-CM | POA: Diagnosis not present

## 2024-07-06 DIAGNOSIS — E538 Deficiency of other specified B group vitamins: Secondary | ICD-10-CM

## 2024-07-06 LAB — POC URINALSYSI DIPSTICK (AUTOMATED)
Bilirubin, UA: NEGATIVE
Blood, UA: POSITIVE
Glucose, UA: NEGATIVE
Ketones, UA: NEGATIVE
Nitrite, UA: NEGATIVE
Protein, UA: POSITIVE — AB
Spec Grav, UA: >=1.03 — AB (ref 1.010–1.025)
Urobilinogen, UA: 0.2 U/dL
pH, UA: 6 (ref 5.0–8.0)

## 2024-07-06 LAB — CBC
HCT: 41.8 % (ref 36.0–46.0)
Hemoglobin: 13.7 g/dL (ref 12.0–15.0)
MCHC: 32.7 g/dL (ref 30.0–36.0)
MCV: 87.2 fl (ref 78.0–100.0)
Platelets: 305 K/uL (ref 150.0–400.0)
RBC: 4.8 Mil/uL (ref 3.87–5.11)
RDW: 12.5 % (ref 11.5–15.5)
WBC: 9.3 K/uL (ref 4.0–10.5)

## 2024-07-06 LAB — LIPID PANEL
Cholesterol: 191 mg/dL (ref 0–200)
HDL: 67.7 mg/dL (ref >39.00)
LDL Cholesterol: 107 mg/dL — ABNORMAL HIGH (ref 0–99)
NonHDL: 122.99
Total CHOL/HDL Ratio: 3
Triglycerides: 78 mg/dL (ref 0.0–149.0)
VLDL: 15.6 mg/dL (ref 0.0–40.0)

## 2024-07-06 LAB — COMPREHENSIVE METABOLIC PANEL WITH GFR
ALT: 12 U/L (ref 0–35)
AST: 14 U/L (ref 0–37)
Albumin: 4.5 g/dL (ref 3.5–5.2)
Alkaline Phosphatase: 61 U/L (ref 39–117)
BUN: 15 mg/dL (ref 6–23)
CO2: 27 meq/L (ref 19–32)
Calcium: 9.2 mg/dL (ref 8.4–10.5)
Chloride: 102 meq/L (ref 96–112)
Creatinine, Ser: 0.72 mg/dL (ref 0.40–1.20)
GFR: 108.97 mL/min (ref >60.00)
Glucose, Bld: 92 mg/dL (ref 70–99)
Potassium: 4.4 meq/L (ref 3.5–5.1)
Sodium: 140 meq/L (ref 135–145)
Total Bilirubin: 0.4 mg/dL (ref 0.2–1.2)
Total Protein: 7.1 g/dL (ref 6.0–8.3)

## 2024-07-06 LAB — VITAMIN B12: Vitamin B-12: 208 pg/mL — ABNORMAL LOW (ref 211–911)

## 2024-07-06 LAB — POCT URINE PREGNANCY: Preg Test, Ur: NEGATIVE

## 2024-07-06 LAB — VITAMIN D 25 HYDROXY (VIT D DEFICIENCY, FRACTURES): VITD: 26.14 ng/mL — ABNORMAL LOW (ref 30.00–100.00)

## 2024-07-06 MED ORDER — NITROFURANTOIN MONOHYD MACRO 100 MG PO CAPS
100.0000 mg | ORAL_CAPSULE | Freq: Two times a day (BID) | ORAL | 0 refills | Status: DC
Start: 2024-07-06 — End: 2024-08-28

## 2024-07-06 MED ORDER — FLUCONAZOLE 150 MG PO TABS: 150.0000 mg | ORAL_TABLET | Freq: Once | ORAL | 0 refills | Status: AC

## 2024-07-06 NOTE — Progress Notes (Signed)
 Complete physical exam  Patient: Cassandra Russell   DOB: 12/20/1990   33 y.o. Female  MRN: [id number]  Subjective:    Chief Complaint  Patient presents with   Annual Exam    fasting   She is here for a complete physical exam.   Discussed the use of AI scribe software for clinical note transcription with the patient, who gave verbal consent to proceed.  History of Present Illness Cassandra Russell is a 33 year old female who presents for her annual physical exam and with acute UTI symptoms.  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 - No tobacco, alcohol, or recreational drug use - Exercises two to three times per week, primarily walking - Weight loss from 170 lbs in June to 166 lbs - Bowel movements are regular    Health Maintenance  Topic Date Due   Hepatitis B Vaccine (1 of 3 - 19+ 3-dose series) Never done   COVID-19 Vaccine (3 - 2024-25 season) 07/11/2023   Flu Shot  06/09/2024   Pap with HPV screening  04/01/2025   DTaP/Tdap/Td vaccine (3 - Td or Tdap) 01/21/2027   HPV Vaccine  Completed   Hepatitis C Screening  Completed   HIV Screening  Completed   Pneumococcal Vaccine  Aged Out   Meningitis B Vaccine  Aged Out    Wears seatbelt always, smoke detectors in home and functioning, does not text while driving, feels safe in home environment.  Depression screening:    04/11/2024    10:03 AM 04/05/2024    1:44 PM  Depression screen PHQ 2/9  Decreased Interest 0 0  Down, Depressed, Hopeless 0 0  PHQ - 2 Score 0 0   Anxiety Screening:     No data to display          Patient Care Team: Lendia Boby CROME, NP-C as PCP - General (Family Medicine)   Outpatient Medications Prior to Visit  Medication Sig   etonogestrel -ethinyl estradiol  (NUVARING) 0.12-0.015 MG/24HR vaginal ring Place 1 each vaginally every 28 (twenty-eight) days. Insert vaginally and leave in place for 4 consecutive weeks, then switch to a new ring.  Continuous use.   [DISCONTINUED] ibuprofen  (ADVIL ) 600 MG tablet Take 1 tablet (600 mg total) by mouth every 6 (six) hours as needed. (Patient not taking: Reported on 07/06/2024)   [DISCONTINUED] tamsulosin  (FLOMAX ) 0.4 MG CAPS capsule Take 1 capsule (0.4 mg total) by mouth daily. (Patient not taking: Reported on 07/06/2024)   No facility-administered medications prior to visit.    Review of Systems  Constitutional:  Negative for chills and fever.  HENT:  Negative for congestion, ear pain, sinus pain and sore throat.   Eyes:  Negative for blurred vision, double vision and pain.  Respiratory:  Negative for cough, shortness of breath and wheezing.   Cardiovascular:  Negative for chest pain, palpitations and leg swelling.  Gastrointestinal:  Negative for abdominal pain, constipation, diarrhea, nausea and vomiting.  Genitourinary:  Positive for dysuria, frequency  and urgency.  Musculoskeletal:  Negative for back pain, joint pain and myalgias.  Skin:  Negative for rash.  Neurological:  Negative for dizziness, tingling, focal weakness and headaches.  Psychiatric/Behavioral:  Negative for depression. The patient is not nervous/anxious.        Objective:    BP 112/80   Pulse 75   Temp 97.6 F (36.4 C) (Temporal)   Ht 5' 1 (1.549 m)   Wt 166 lb (75.3 kg)   SpO2 95%   BMI 31.37 kg/m  BP Readings from Last 3 Encounters:  07/06/24 112/80  04/11/24  108/64  04/05/24 114/76   Wt Readings from Last 3 Encounters:  07/06/24 166 lb (75.3 kg)  04/11/24 170 lb 9.6 oz (77.4 kg)  04/05/24 171 lb (77.6 kg)    Physical Exam Constitutional:      General: She is not in acute distress.    Appearance: She is not ill-appearing.  HENT:     Right Ear: Tympanic membrane, ear canal and external ear normal.     Left Ear: Tympanic membrane, ear canal and external ear normal.     Nose: Nose normal.     Mouth/Throat:     Mouth: Mucous membranes are moist.     Pharynx: Oropharynx is clear.  Eyes:     Extraocular Movements: Extraocular movements intact.     Conjunctiva/sclera: Conjunctivae normal.     Pupils: Pupils are equal, round, and reactive to light.  Neck:     Thyroid : No thyroid  mass, thyromegaly or thyroid  tenderness.  Cardiovascular:     Rate and Rhythm: Normal rate and regular rhythm.     Pulses: Normal pulses.     Heart sounds: Normal heart sounds.  Pulmonary:     Effort: Pulmonary effort is normal.     Breath sounds: Normal breath sounds.  Abdominal:     General: Bowel sounds are normal.     Palpations: Abdomen is soft.     Tenderness: There is no abdominal tenderness. There is no right CVA tenderness, left CVA tenderness, guarding or rebound.  Musculoskeletal:        General: Normal range of motion.     Cervical back: Normal range of motion and neck supple. No tenderness.     Right lower leg: No edema.     Left lower leg: No edema.  Lymphadenopathy:     Cervical: No cervical adenopathy.  Skin:    General: Skin is warm and dry.     Findings: No lesion or rash.  Neurological:     General: No focal deficit present.     Mental Status: She is alert and oriented to person, place, and time.     Cranial Nerves: No cranial nerve deficit.     Sensory: No sensory deficit.     Motor: No weakness.     Gait: Gait normal.  Psychiatric:        Mood and Affect: Mood normal.        Behavior: Behavior normal.        Thought Content:  Thought content normal.      Results for orders placed or performed in visit on 07/06/24  POCT Urinalysis Dipstick (Automated)  Result Value Ref Range   Color, UA yellow    Clarity, UA cloudy    Glucose, UA Negative Negative   Bilirubin, UA negative    Ketones, UA negative    Spec Grav, UA >=1.030 (A) 1.010 - 1.025   Blood, UA positive  pH, UA 6.0 5.0 - 8.0   Protein, UA Positive (A) Negative   Urobilinogen, UA 0.2 0.2 or 1.0 E.U./dL   Nitrite, UA negative    Leukocytes, UA Large (3+) (A) Negative  POCT urine pregnancy  Result Value Ref Range   Preg Test, Ur Negative Negative      Assessment & Plan:    Routine Health Maintenance and Physical Exam Problem List Items Addressed This Visit   None Visit Diagnoses       Encounter for general adult medical examination with abnormal findings    -  Primary   Relevant Orders   CBC   Comprehensive metabolic panel with GFR     Vitamin D  deficiency       Relevant Orders   VITAMIN D  25 Hydroxy (Vit-D Deficiency, Fractures)     Acute cystitis with hematuria       Relevant Orders   Urine Culture     Dysuria       Relevant Orders   POCT Urinalysis Dipstick (Automated) (Completed)   Urine Culture     Vitamin B12 deficiency       Relevant Orders   Vitamin B12     PCOS (polycystic ovarian syndrome)         Abnormal uterine bleeding (AUB)       Relevant Orders   POCT urine pregnancy (Completed)     Screening for lipid disorders       Relevant Orders   Lipid panel       Assessment and Plan Assessment & Plan Adult Wellness Visit Routine annual physical exam. -Preventive health care reviewed.  Counseling on healthy lifestyle including diet and exercise.  Recommend regular dental and eye exams.  Immunizations reviewed.  Discussed safety.  - Up-to-date OB GYN visit - Encourage dental visit - Schedule eye exam in December - Verify hepatitis B vaccination status - Encourage regular exercise - Appropriate labs ordered  today.  She is fasting.  Acute cystitis with hematuria Acute urinary tract infection with symptoms of dysuria, urgency, and frequency since Tuesday. Urinalysis shows 3+ leukocytes and red blood cells. No current menstruation. History of UTIs, but not frequent or difficult to treat. Prefers not to use keflex  due to past side effects (nausea and diarrhea). - Prescribe nitrofurantoin  - Check for pregnancy before prescribing Diflucan . Urine pregnancy test negative  - urine culture ordered   Vitamin D  deficiency Vitamin D  deficiency with inconsistent supplementation of 5000 IU daily. - Recheck vitamin D  levels - Encourage consistent intake of over-the-counter vitamin D  supplement  Deficiency of B group vitamins Low normal B12 levels with current supplementation. - Recheck B12 levels  Polycystic ovarian syndrome with irregular menstruation PCOS with irregular menstrual cycles. Last menstrual period was on August 9th. No current concern for pregnancy.     Return in about 1 year (around 07/06/2025).     Boby Mackintosh, NP-C

## 2024-07-06 NOTE — Progress Notes (Signed)
 Please reach out to her to get her scheduled for vitamin B12 injections weekly x 4 weeks and then monthly going forward.

## 2024-07-09 LAB — URINE CULTURE

## 2024-07-17 ENCOUNTER — Ambulatory Visit

## 2024-08-28 ENCOUNTER — Telehealth: Admitting: Emergency Medicine

## 2024-08-28 DIAGNOSIS — B379 Candidiasis, unspecified: Secondary | ICD-10-CM | POA: Diagnosis not present

## 2024-08-28 DIAGNOSIS — T3695XA Adverse effect of unspecified systemic antibiotic, initial encounter: Secondary | ICD-10-CM | POA: Diagnosis not present

## 2024-08-28 DIAGNOSIS — R3989 Other symptoms and signs involving the genitourinary system: Secondary | ICD-10-CM

## 2024-08-28 MED ORDER — FLUCONAZOLE 150 MG PO TABS: 150.0000 mg | ORAL_TABLET | ORAL | 0 refills | Status: DC | PRN

## 2024-08-28 MED ORDER — NITROFURANTOIN MONOHYD MACRO 100 MG PO CAPS: 100.0000 mg | ORAL_CAPSULE | Freq: Two times a day (BID) | ORAL | 0 refills | Status: DC

## 2024-08-28 NOTE — Addendum Note (Signed)
 Addended by: VIVIENNE DELON HERO on: 08/28/2024 07:35 PM   Modules accepted: Orders

## 2024-08-28 NOTE — Progress Notes (Signed)

## 2024-09-10 ENCOUNTER — Ambulatory Visit (HOSPITAL_COMMUNITY)
Admission: RE | Admit: 2024-09-10 | Discharge: 2024-09-10 | Disposition: A | Discharge status: Home / Self Care | Source: Ambulatory Visit | Attending: Emergency Medicine | Admitting: Emergency Medicine

## 2024-09-10 ENCOUNTER — Other Ambulatory Visit: Payer: Self-pay

## 2024-09-10 ENCOUNTER — Encounter (HOSPITAL_COMMUNITY): Payer: Self-pay

## 2024-09-10 VITALS — BP 128/73 | HR 77 | Temp 98.2°F | Resp 18

## 2024-09-10 DIAGNOSIS — N3001 Acute cystitis with hematuria: Secondary | ICD-10-CM

## 2024-09-10 LAB — POCT URINE DIPSTICK
Bilirubin, UA: NEGATIVE
Glucose, UA: NEGATIVE mg/dL
Ketones, POC UA: NEGATIVE mg/dL
Nitrite, UA: NEGATIVE
POC PROTEIN,UA: NEGATIVE
Spec Grav, UA: 1.02 (ref 1.010–1.025)
Urobilinogen, UA: 0.2 U/dL
pH, UA: 7 (ref 5.0–8.0)

## 2024-09-10 LAB — POCT URINE PREGNANCY: Preg Test, Ur: NEGATIVE

## 2024-09-10 MED ORDER — SULFAMETHOXAZOLE-TRIMETHOPRIM 800-160 MG PO TABS: 1.0000 | ORAL_TABLET | Freq: Two times a day (BID) | ORAL | 0 refills | Status: AC

## 2024-09-10 MED ORDER — FLUCONAZOLE 150 MG PO TABS: 150.0000 mg | ORAL_TABLET | Freq: Once | ORAL | 0 refills | Status: DC | PRN

## 2024-09-10 NOTE — Discharge Instructions (Addendum)
 We will call you if anything on urine culture requires a change in therapy (about 1-2 days)  In the meantime I am treating you for a urinary tract infection. Please take the antibiotic Bactrim  as prescribed, with food to avoid upset stomach. Drink lots of fluids! I have also sent fluconazole  to prevent yeast infection  Please contact your primary care provider for follow up

## 2024-09-10 NOTE — ED Triage Notes (Signed)
 Was treated for a uti 10/20.  Took a 5 day script for macrobid .  2 days later symptoms returned.    Complains of urinary frequency, urgency, burning with urination

## 2024-09-10 NOTE — ED Provider Notes (Signed)
 [facility]    CSN: [id number] Arrival date & time: 09/10/24  0802      History   Chief Complaint Chief Complaint  Patient presents with   Urinary Frequency    I was on antibiotics for 5 days, but symptoms came back days after finishing treatment. Symptoms include urinary frequency, urgency and pain. - Entered by patient    HPI Cassandra Russell is a 33 y.o. female.  Did an E-visit on 10/20 for UTI symptoms. Was given macrobid  BID x 5 days. She finished course but 2 days later symptoms returned after intercourse. Now 5 day history of dysuria, urgency and frequency Denies abd pain or flank pain, fever, nausea/vomiting, vaginal discharge, itching, odor, or rash  LMP 9/22  Past Medical History:  Diagnosis Date   Chlamydia    Conjunctivitis    DVT (deep vein thrombosis) in pregnancy 2018   arm   History of chlamydia 04/13/2012   History of preterm delivery 09/22/2016   PTL [redacted]w[redacted]d     HSV-2 infection    Kidney stone    PCOS (polycystic ovarian syndrome) 10/31/2014   Preterm labor    Supraventricular tachycardia 04/05/2024   s/p eval by cards - at next visit see if pt still f/w cards     SVT (supraventricular tachycardia) 10/2013   Adenosine  was given in the ED ([facility]), pt was not admitted.    Patient Active Problem List   Diagnosis Date Noted   History of renal stone 04/05/2024   Obesity (BMI 30.0-34.9) 04/05/2024   Preterm premature rupture of membranes (PPROM) with unknown onset of labor 03/04/2017   Preterm labor 02/28/2017   Occlusive thrombus 12/23/2016   Cervical shortening affecting pregnancy in third trimester 12/15/2016   H/O preterm delivery, currently pregnant, third trimester 12/15/2016   Rh negative state in antepartum period, third trimester 12/15/2016   Supervision of high risk pregnancy in third trimester 12/15/2016   Cervical cerclage suture present in third trimester 12/08/2016   History of cervical cerclage 12/08/2016   History of  supraventricular tachycardia 09/24/2015   Dysplasia of cervix, low grade (CIN 1) 08/11/2012    Past Surgical History:  Procedure Laterality Date   CERVICAL CERCLAGE N/A 11/11/2016   Procedure: CERCLAGE CERVICAL;  Surgeon: Oneil FORBES Piety, MD;  Location: [facility];  Service: Gynecology;  Laterality: N/A;   CLOSED REDUCTION FINGER WITH PERCUTANEOUS PINNING Left 01/24/2014   Procedure: LEFT SMALL FINGER CLOSED REDUCTION WITH PINNING/POSSIBLE ORIF;  Surgeon: Prentice LELON Pagan, MD;  Location: [facility];  Service: Orthopedics;  Laterality: Left;   FRACTURE SURGERY     L pinky   INTRAUTERINE DEVICE INSERTION  11/10/2007   WISDOM TOOTH EXTRACTION      OB History     Gravida  2   Para  2   Term      Preterm  2   AB      Living  2      SAB      IAB      Ectopic      Multiple  0   Live Births  2            Home Medications    Prior to Admission medications   Medication Sig Start Date End Date Taking? Authorizing Provider  fluconazole  (DIFLUCAN ) 150 MG tablet Take 1 tablet (150 mg total) by mouth once as needed for up to 2 doses (take one pill on day 1, and the second pill 3 days later).  09/10/24  Yes Andee Chivers, [place], PA-C  sulfamethoxazole -trimethoprim  (BACTRIM  DS) 800-160 MG tablet Take 1 tablet by mouth 2 (two) times daily for 3 days. 09/10/24 09/13/24 Yes Kamori Kitchens, [place], PA-C  etonogestrel -ethinyl estradiol  (NUVARING) 0.12-0.015 MG/24HR vaginal ring Insert 1 vaginally and leave in place for 4 consecutive weeks, then switch to a new ring and repeat. 04/11/24   Prentiss Annabella LABOR, NP    Family History Family History  Problem Relation Age of Onset   Hypertension Mother    Hyperlipidemia Mother    Hyperlipidemia Father    Diabetes Maternal Grandmother     Social History Social History   Tobacco Use   Smoking status: Never    Passive exposure: Never   Smokeless tobacco: Never  Vaping Use   Vaping status: Never Used  Substance Use Topics   Alcohol use: Not Currently     Comment: occ   Drug use: No     Allergies   Patient has no known allergies.   Review of Systems Review of Systems As per HPI  Physical Exam Triage Vital Signs ED Triage Vitals  Encounter Vitals Group     BP 09/10/24 0823 128/73     Girls Systolic BP Percentile --      Girls Diastolic BP Percentile --      Boys Systolic BP Percentile --      Boys Diastolic BP Percentile --      Pulse Rate 09/10/24 0823 77     Resp 09/10/24 0823 18     Temp 09/10/24 0823 98.2 F (36.8 C)     Temp Source 09/10/24 0823 Oral     SpO2 09/10/24 0823 98 %     Weight --      Height --      Head Circumference --      Peak Flow --      Pain Score 09/10/24 0819 5     Pain Loc --      Pain Education --      Exclude from Growth Chart --    No data found.  Updated Vital Signs BP 128/73 (BP Location: Right Arm)   Pulse 77   Temp 98.2 F (36.8 C) (Oral)   Resp 18   LMP 07/31/2024 (Approximate)   SpO2 98%    Physical Exam Vitals and nursing note reviewed.  Constitutional:      General: She is not in acute distress. HENT:     Mouth/Throat:     Mouth: Mucous membranes are moist.     Pharynx: Oropharynx is clear.  Eyes:     Conjunctiva/sclera: Conjunctivae normal.     Pupils: Pupils are equal, round, and reactive to light.  Cardiovascular:     Rate and Rhythm: Normal rate and regular rhythm.     Heart sounds: Normal heart sounds.  Pulmonary:     Effort: Pulmonary effort is normal.     Breath sounds: Normal breath sounds.  Abdominal:     Palpations: Abdomen is soft.     Tenderness: There is no abdominal tenderness. There is no right CVA tenderness, left CVA tenderness or guarding.  Neurological:     Mental Status: She is alert and oriented to person, place, and time.      UC Treatments / Results  Labs (all labs ordered are listed, but only abnormal results are displayed) Labs Reviewed  POCT URINE DIPSTICK - Abnormal; Notable for the following components:      Result Value    Clarity, UA cloudy (*)  Blood, UA trace-intact (*)    Leukocytes, UA Trace (*)    All other components within normal limits  URINE CULTURE  POCT URINE PREGNANCY    EKG  Radiology No results found.  Procedures Procedures (including critical care time)  Medications Ordered in UC Medications - No data to display  Initial Impression / Assessment and Plan / UC Course  I have reviewed the triage vital signs and the nursing notes.  Pertinent labs & imaging results that were available during my care of the patient were reviewed by me and considered in my medical decision making (see chart for details).  UPT negative  UA trace leuks and RBC Culture pending. Treat for acute cystitis with Bactrim  BID x 3 days. Will notify if culture requires change in therapy.  History of abx associated yeast infection - sent fluconazole  Declines vaginal swab today. Agrees to plan, questions answered   Initially offered keflex  but patient reports it gives her diarrhea  Final Clinical Impressions(s) / UC Diagnoses   Final diagnoses:  Acute cystitis with hematuria     Discharge Instructions      We will call you if anything on urine culture requires a change in therapy (about 1-2 days)  In the meantime I am treating you for a urinary tract infection. Please take the antibiotic Bactrim  as prescribed, with food to avoid upset stomach. Drink lots of fluids! I have also sent fluconazole  to prevent yeast infection  Please contact your primary care provider for follow up      ED Prescriptions     Medication Sig Dispense Auth. Provider   sulfamethoxazole -trimethoprim  (BACTRIM  DS) 800-160 MG tablet Take 1 tablet by mouth 2 (two) times daily for 3 days. 6 tablet Selma Rodelo, PA-C   fluconazole  (DIFLUCAN ) 150 MG tablet Take 1 tablet (150 mg total) by mouth once as needed for up to 2 doses (take one pill on day 1, and the second pill 3 days later). 2 tablet Aida Lemaire, [place], PA-C       PDMP not reviewed this encounter.   Jeryl [place]berry, [place] 09/10/24 [phone]

## 2024-09-12 ENCOUNTER — Ambulatory Visit (HOSPITAL_COMMUNITY): Payer: Self-pay | Admitting: Emergency Medicine

## 2024-09-12 LAB — URINE CULTURE: Culture: >=100000 — AB

## 2024-10-23 ENCOUNTER — Encounter: Payer: Self-pay | Admitting: Nurse Practitioner

## 2024-10-23 ENCOUNTER — Telehealth: Admitting: Family Medicine

## 2024-10-23 DIAGNOSIS — N390 Urinary tract infection, site not specified: Secondary | ICD-10-CM

## 2024-10-23 NOTE — Progress Notes (Signed)
 Because you have had several UTI's over the last months, your condition is more complicated due to recurrence and resistance, you need to be seen in person for urine sample prior to medication starts. We feel your condition warrants further evaluation and  recommend that you be seen in a face-to-face visit.   NOTE: There will be NO CHARGE for this E-Visit   If you are having a true medical emergency, please call 911.

## 2024-10-24 ENCOUNTER — Ambulatory Visit
Admission: EM | Admit: 2024-10-24 | Discharge: 2024-10-24 | Disposition: A | Discharge status: Home / Self Care | Source: Home / Self Care

## 2024-10-24 ENCOUNTER — Other Ambulatory Visit: Payer: Self-pay | Admitting: Nurse Practitioner

## 2024-10-24 DIAGNOSIS — N3001 Acute cystitis with hematuria: Secondary | ICD-10-CM | POA: Diagnosis not present

## 2024-10-24 DIAGNOSIS — R300 Dysuria: Secondary | ICD-10-CM | POA: Insufficient documentation

## 2024-10-24 DIAGNOSIS — N390 Urinary tract infection, site not specified: Secondary | ICD-10-CM

## 2024-10-24 LAB — POCT URINE DIPSTICK
Bilirubin, UA: NEGATIVE
Glucose, UA: NEGATIVE mg/dL
Ketones, POC UA: NEGATIVE mg/dL
Nitrite, UA: NEGATIVE
POC PROTEIN,UA: 30 — AB
Spec Grav, UA: 1.025 (ref 1.010–1.025)
Urobilinogen, UA: 0.2 U/dL
pH, UA: 5.5 (ref 5.0–8.0)

## 2024-10-24 LAB — POCT URINE PREGNANCY: Preg Test, Ur: NEGATIVE

## 2024-10-24 MED ORDER — FLUCONAZOLE 150 MG PO TABS: 150.0000 mg | ORAL_TABLET | ORAL | 0 refills | Status: AC

## 2024-10-24 MED ORDER — SULFAMETHOXAZOLE-TRIMETHOPRIM 800-160 MG PO TABS: 1.0000 | ORAL_TABLET | Freq: Two times a day (BID) | ORAL | 0 refills | Status: AC

## 2024-10-24 MED ORDER — NITROFURANTOIN MONOHYD MACRO 100 MG PO CAPS: 100.0000 mg | ORAL_CAPSULE | ORAL | 0 refills | Status: DC | PRN

## 2024-10-24 NOTE — ED Triage Notes (Signed)
 Pt reports increase urinary frequency, burning with urination x 2 days. Pt has not taken any meds for complaints.

## 2024-10-24 NOTE — Discharge Instructions (Addendum)
 Please start Bactrim  to address an urinary tract infection. Use fluconazole  to help with yeast infection from antibiotic use. Make sure you hydrate very well with plain water and a quantity of 80 ounces of water a day.  Please limit drinks that are considered urinary irritants such as fruit juices, soda, sweet tea, coffee, artifical sweetened drinks, energy drinks, alcohol.  These can worsen your urinary and genital symptoms but also be the source of them.  I will let you know about your urine culture results through MyChart to see if we need to prescribe or change your antibiotics based off of those results.

## 2024-10-24 NOTE — ED Provider Notes (Signed)
 [facility]  Note:  This document was prepared using Conservation officer, historic buildings and may include unintentional dictation errors.  MRN: [id number] DOB: [birth year]  Subjective:   Cassandra Russell is a 33 y.o. female presenting for 2-day history of dysuria, urinary frequency and urgency.  Has had bouts of UTIs over the past 2 years.  Last episode was a month ago.  Did well with Bactrim  and would like to try this again.  Admits that she does not drink much fluids or water.  Feels that her UTIs are associated with sex as well.  Has reached out to her gynecologist but has not gotten an appointment set up for follow-up.  Has a history of nephrolithiasis 1 time but feels very different for her.  Current Outpatient Medications  Medication Instructions   etonogestrel -ethinyl estradiol  (NUVARING) 0.12-0.015 MG/24HR vaginal ring Insert 1 vaginally and leave in place for 4 consecutive weeks, then switch to a new ring and repeat.   fluconazole  (DIFLUCAN ) 150 mg, Oral, Once PRN    Allergies[1]  Past Medical History:  Diagnosis Date   Chlamydia    Conjunctivitis    DVT (deep vein thrombosis) in pregnancy 2018   arm   History of chlamydia 04/13/2012   History of preterm delivery 09/22/2016   PTL [redacted]w[redacted]d     HSV-2 infection    Kidney stone    PCOS (polycystic ovarian syndrome) 10/31/2014   Preterm labor    Supraventricular tachycardia 04/05/2024   s/p eval by cards - at next visit see if pt still f/w cards     SVT (supraventricular tachycardia) 10/2013   Adenosine  was given in the ED ([facility]), pt was not admitted.     Past Surgical History:  Procedure Laterality Date   CERVICAL CERCLAGE N/A 11/11/2016   Procedure: CERCLAGE CERVICAL;  Surgeon: Oneil FORBES Piety, MD;  Location: [facility];  Service: Gynecology;  Laterality: N/A;   CLOSED REDUCTION FINGER WITH PERCUTANEOUS PINNING Left 01/24/2014   Procedure: LEFT SMALL FINGER CLOSED REDUCTION WITH PINNING/POSSIBLE  ORIF;  Surgeon: Prentice LELON Pagan, MD;  Location: [facility];  Service: Orthopedics;  Laterality: Left;   FRACTURE SURGERY     L pinky   INTRAUTERINE DEVICE INSERTION  11/10/2007   WISDOM TOOTH EXTRACTION      Family History  Problem Relation Age of Onset   Hypertension Mother    Hyperlipidemia Mother    Hyperlipidemia Father    Diabetes Maternal Grandmother     Social History   Occupational History   Not on file  Tobacco Use   Smoking status: Never    Passive exposure: Never   Smokeless tobacco: Never  Vaping Use   Vaping status: Never Used  Substance and Sexual Activity   Alcohol use: Not Currently    Comment: occ   Drug use: No   Sexual activity: Yes    Partners: Male    Birth control/protection: Inserts    Comment: 1st intercourse- 15, nuvaring     ROS   Objective:   Vitals: BP 126/80 (BP Location: Right Arm)   Pulse 71   Temp 99 F (37.2 C) (Oral)   Resp 16   LMP 10/04/2024 (Approximate)   SpO2 98%   Physical Exam Constitutional:      General: She is not in acute distress.    Appearance: Normal appearance. She is well-developed. She is not ill-appearing, toxic-appearing or diaphoretic.  HENT:     Head: Normocephalic and atraumatic.  Nose: Nose normal.     Mouth/Throat:     Mouth: Mucous membranes are moist.     Pharynx: Oropharynx is clear.  Eyes:     General: No scleral icterus.       Right eye: No discharge.        Left eye: No discharge.     Extraocular Movements: Extraocular movements intact.     Conjunctiva/sclera: Conjunctivae normal.  Cardiovascular:     Rate and Rhythm: Normal rate.  Pulmonary:     Effort: Pulmonary effort is normal.  Abdominal:     General: Bowel sounds are normal. There is no distension.     Palpations: Abdomen is soft. There is no mass.     Tenderness: There is no abdominal tenderness. There is no right CVA tenderness, left CVA tenderness, guarding or rebound.  Skin:    General: Skin is warm and dry.  Neurological:      General: No focal deficit present.     Mental Status: She is alert and oriented to person, place, and time.  Psychiatric:        Mood and Affect: Mood normal.        Behavior: Behavior normal.        Thought Content: Thought content normal.        Judgment: Judgment normal.     Results for orders placed or performed during the hospital encounter of 10/24/24 (from the past 24 hours)  POCT urine pregnancy     Status: None   Collection Time: 10/24/24  9:33 AM  Result Value Ref Range   Preg Test, Ur Negative Negative  POCT URINE DIPSTICK     Status: Abnormal   Collection Time: 10/24/24  9:33 AM  Result Value Ref Range   Color, UA yellow yellow   Clarity, UA cloudy (A) clear   Glucose, UA negative negative mg/dL   Bilirubin, UA negative negative   Ketones, POC UA negative negative mg/dL   Spec Grav, UA 8.974 8.989 - 1.025   Blood, UA large (A) negative   pH, UA 5.5 5.0 - 8.0   POC PROTEIN,UA =30 (A) negative, trace   Urobilinogen, UA 0.2 0.2 or 1.0 E.U./dL   Nitrite, UA Negative Negative   Leukocytes, UA Small (1+) (A) Negative    Assessment and Plan :   PDMP not reviewed this encounter.  1. Acute cystitis with hematuria   2. Dysuria      Start Bactrim  to cover for acute cystitis, urine culture pending. Use fluconazole  for antibiotic associated yeast infection. Recommended consistent hydration, limiting urinary irritants. Counseled patient on potential for adverse effects with medications prescribed/recommended today, ER and return-to-clinic precautions discussed, patient verbalized understanding.     [1] No Known Allergies    Christopher Savannah, [place] 10/24/24 [id number]

## 2024-10-27 ENCOUNTER — Ambulatory Visit (HOSPITAL_COMMUNITY): Payer: Self-pay

## 2024-10-27 LAB — URINE CULTURE: Culture: >=100000 — AB

## 2025-04-12 ENCOUNTER — Ambulatory Visit: Admitting: Nurse Practitioner
